# Patient Record
Sex: Female | Born: 1980 | Race: White | Hispanic: No | Marital: Single | Smoking: Current every day smoker
Health system: Southern US, Community
[De-identification: ages and names within clinical notes are randomized; demographics above are authoritative.]

## PROBLEM LIST (undated history)

## (undated) DIAGNOSIS — F419 Anxiety disorder, unspecified: Secondary | ICD-10-CM

## (undated) DIAGNOSIS — F1910 Other psychoactive substance abuse, uncomplicated: Secondary | ICD-10-CM

## (undated) DIAGNOSIS — F32A Depression, unspecified: Secondary | ICD-10-CM

## (undated) DIAGNOSIS — R569 Unspecified convulsions: Secondary | ICD-10-CM

## (undated) DIAGNOSIS — T7840XA Allergy, unspecified, initial encounter: Secondary | ICD-10-CM

## (undated) DIAGNOSIS — G43909 Migraine, unspecified, not intractable, without status migrainosus: Secondary | ICD-10-CM

## (undated) DIAGNOSIS — F329 Major depressive disorder, single episode, unspecified: Secondary | ICD-10-CM

## (undated) DIAGNOSIS — M549 Dorsalgia, unspecified: Secondary | ICD-10-CM

## (undated) DIAGNOSIS — R51 Headache: Secondary | ICD-10-CM

## (undated) HISTORY — DX: Unspecified convulsions: R56.9

## (undated) HISTORY — DX: Other psychoactive substance abuse, uncomplicated: F19.10

## (undated) HISTORY — PX: DILATION AND CURETTAGE OF UTERUS: SHX78

## (undated) HISTORY — DX: Allergy, unspecified, initial encounter: T78.40XA

## (undated) HISTORY — DX: Migraine, unspecified, not intractable, without status migrainosus: G43.909

---
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
## 2002-06-08 ENCOUNTER — Emergency Department (HOSPITAL_COMMUNITY): Admission: EM | Admit: 2002-06-08 | Discharge: 2002-06-08 | Payer: Self-pay | Admitting: Emergency Medicine

## 2002-06-25 ENCOUNTER — Emergency Department (HOSPITAL_COMMUNITY): Admission: EM | Admit: 2002-06-25 | Discharge: 2002-06-26 | Payer: Self-pay | Admitting: Emergency Medicine

## 2007-10-15 ENCOUNTER — Emergency Department (HOSPITAL_COMMUNITY): Admission: EM | Admit: 2007-10-15 | Discharge: 2007-10-15 | Payer: Self-pay | Admitting: Emergency Medicine

## 2008-12-30 ENCOUNTER — Emergency Department (HOSPITAL_COMMUNITY): Admission: EM | Admit: 2008-12-30 | Discharge: 2008-12-30 | Payer: Self-pay | Admitting: Emergency Medicine

## 2009-05-15 ENCOUNTER — Emergency Department (HOSPITAL_COMMUNITY): Admission: EM | Admit: 2009-05-15 | Discharge: 2009-05-15 | Payer: Self-pay | Admitting: Emergency Medicine

## 2009-05-17 ENCOUNTER — Emergency Department (HOSPITAL_COMMUNITY): Admission: EM | Admit: 2009-05-17 | Discharge: 2009-05-17 | Payer: Self-pay | Admitting: Emergency Medicine

## 2009-05-22 ENCOUNTER — Other Ambulatory Visit: Admission: RE | Admit: 2009-05-22 | Discharge: 2009-05-22 | Payer: Self-pay | Admitting: Obstetrics and Gynecology

## 2009-06-20 ENCOUNTER — Ambulatory Visit (HOSPITAL_COMMUNITY): Admission: RE | Admit: 2009-06-20 | Discharge: 2009-06-20 | Payer: Self-pay | Admitting: Obstetrics & Gynecology

## 2009-07-09 ENCOUNTER — Ambulatory Visit (HOSPITAL_COMMUNITY): Admission: RE | Admit: 2009-07-09 | Discharge: 2009-07-09 | Payer: Self-pay | Admitting: Obstetrics & Gynecology

## 2009-08-01 ENCOUNTER — Ambulatory Visit (HOSPITAL_COMMUNITY): Admission: RE | Admit: 2009-08-01 | Discharge: 2009-08-01 | Payer: Self-pay | Admitting: Obstetrics & Gynecology

## 2009-08-15 ENCOUNTER — Ambulatory Visit (HOSPITAL_COMMUNITY): Admission: RE | Admit: 2009-08-15 | Discharge: 2009-08-15 | Payer: Self-pay | Admitting: Obstetrics & Gynecology

## 2009-08-29 ENCOUNTER — Ambulatory Visit (HOSPITAL_COMMUNITY): Admission: RE | Admit: 2009-08-29 | Discharge: 2009-08-29 | Payer: Self-pay | Admitting: Obstetrics & Gynecology

## 2010-07-13 IMAGING — US US OB FOLLOW-UP EACH ADDL GEST (MODIFY)
1 series · 14 of 28 positions shown · non-contrast
Comparison: none

OBSTETRICAL ULTRASOUND:
 This ultrasound was performed in The [HOSPITAL], and the [facility] report will be stored to [REDACTED] PACS.  This report is also available in [HOSPITAL]?s accessANYware.

[Series 1: us ob follow-up each addl gest (modify) · 14 of 91 slices shown]
[im 4/91]
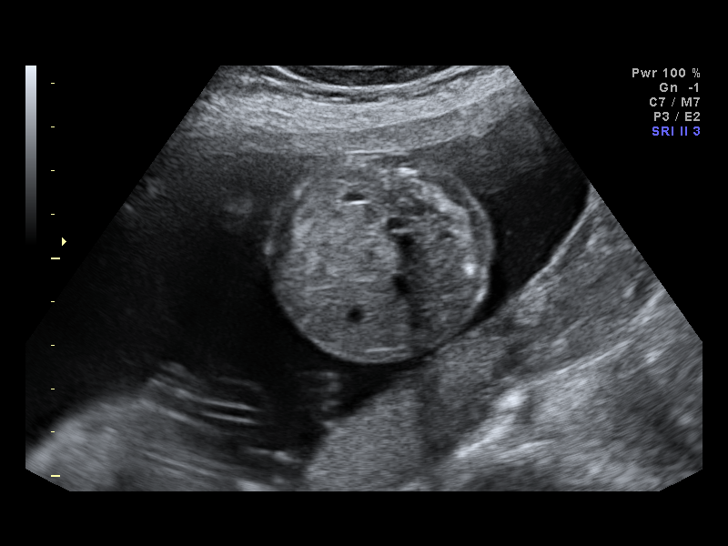
[im 11/91]
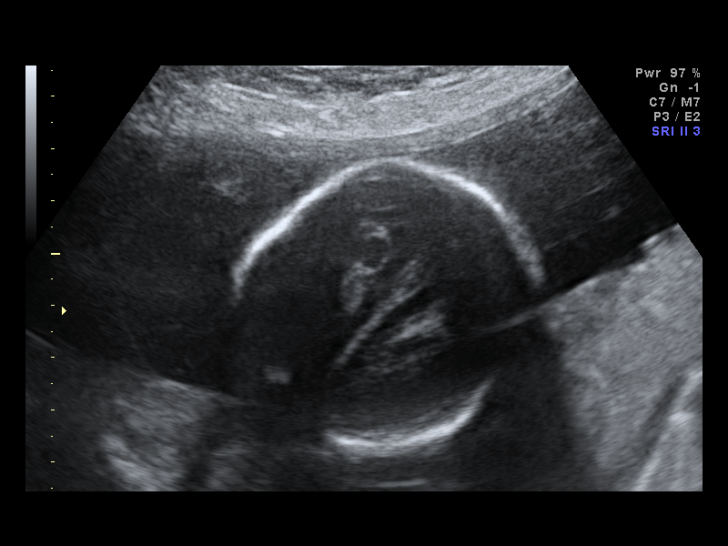
[im 17/91]
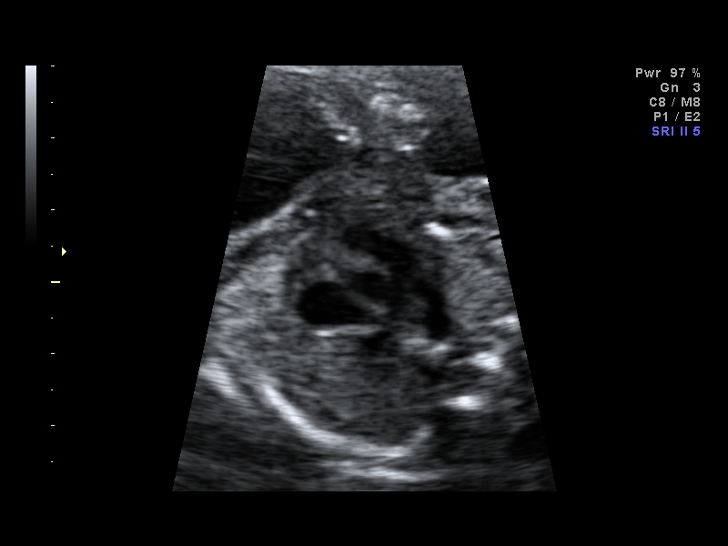
[im 24/91]
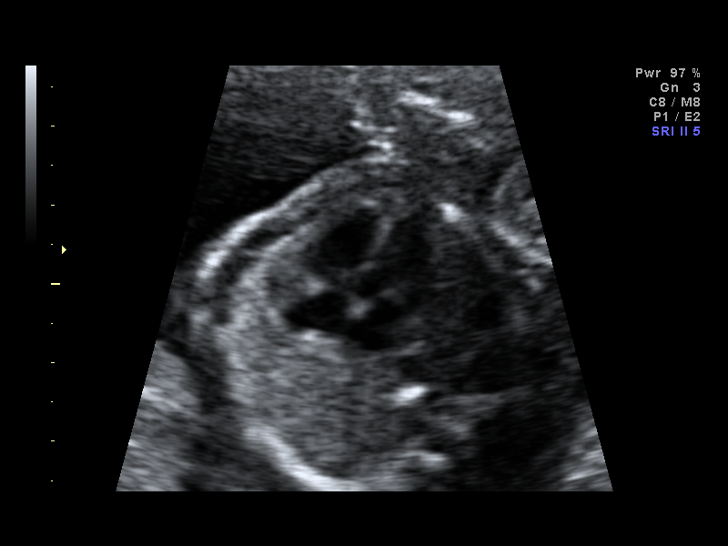
[im 31/91]
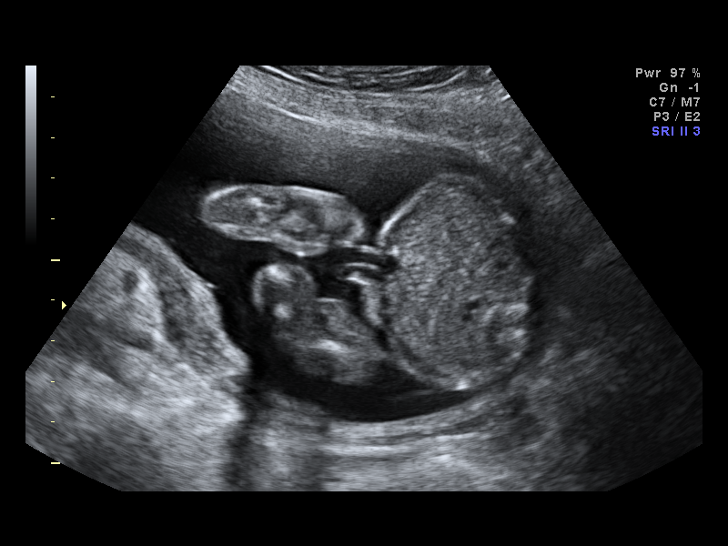
[im 37/91]
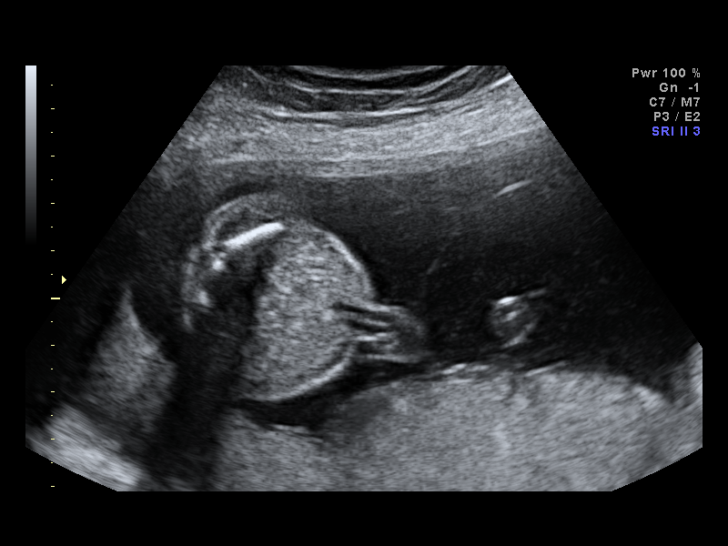
[im 44/91]
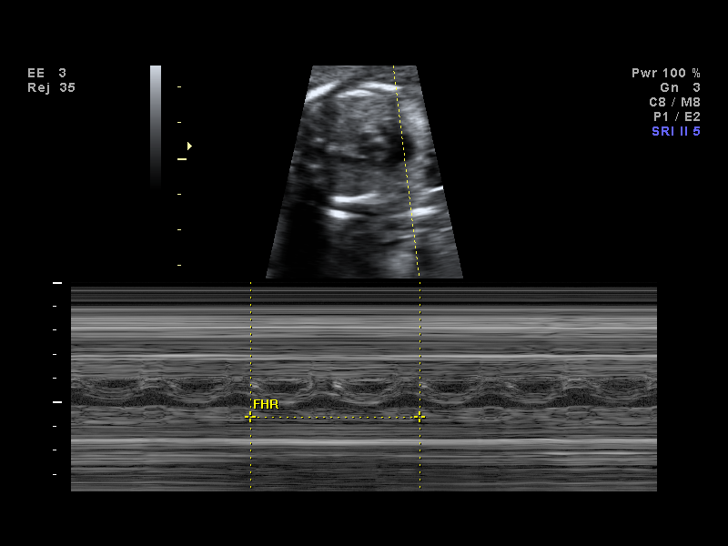
[im 51/91]
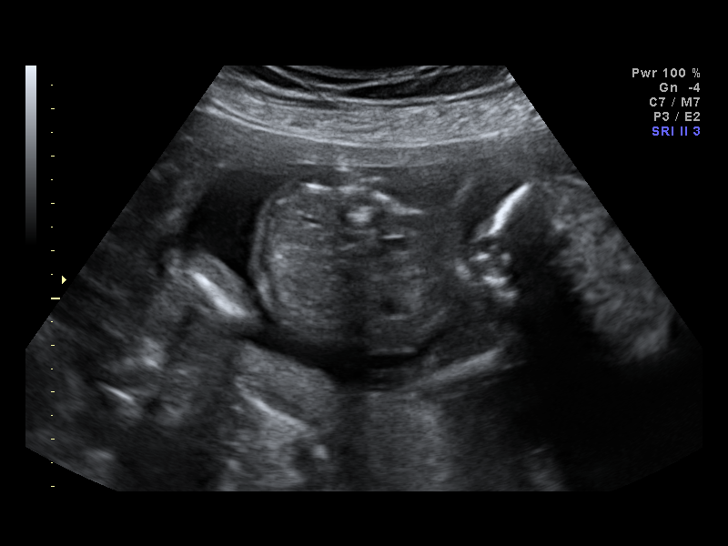
[im 57/91]
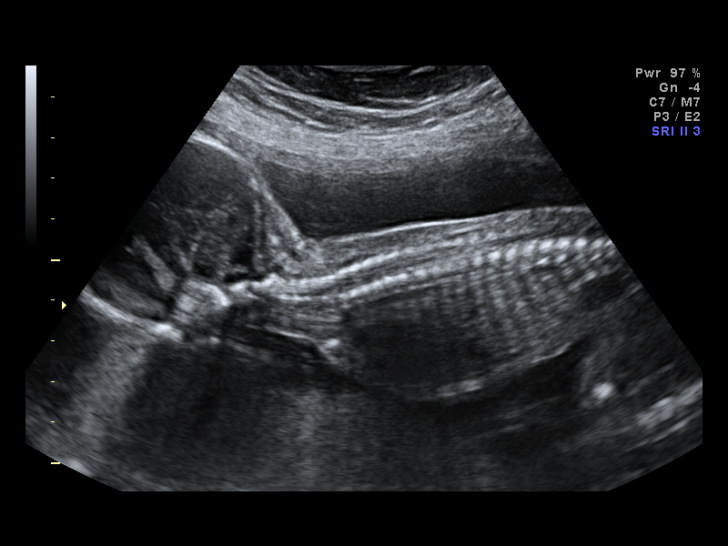
[im 64/91]
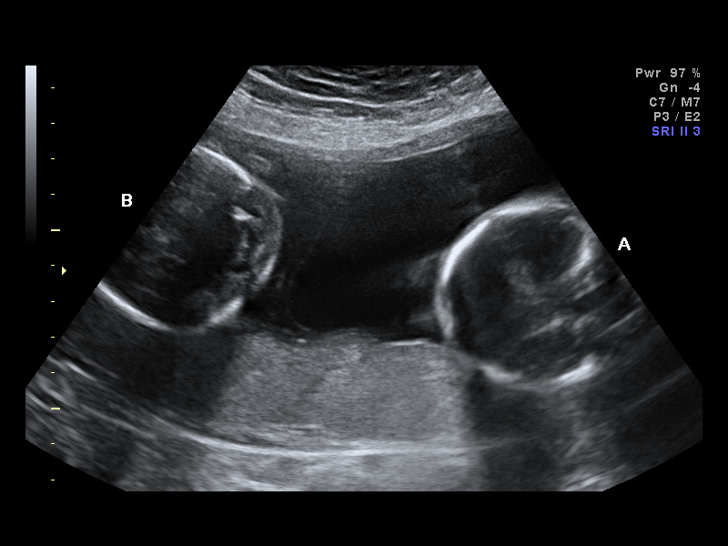
[im 71/91]
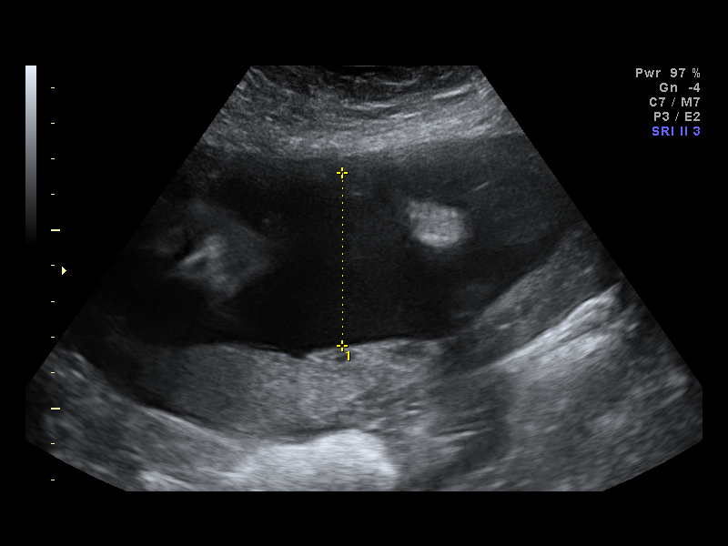
[im 77/91]
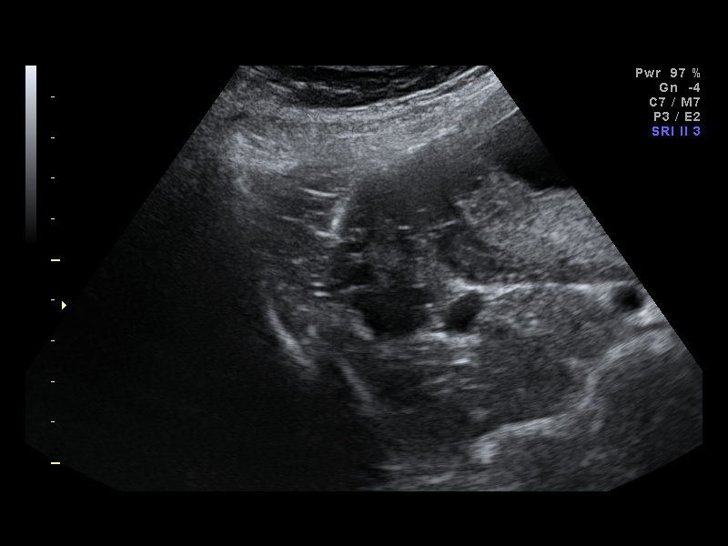
[im 84/91]
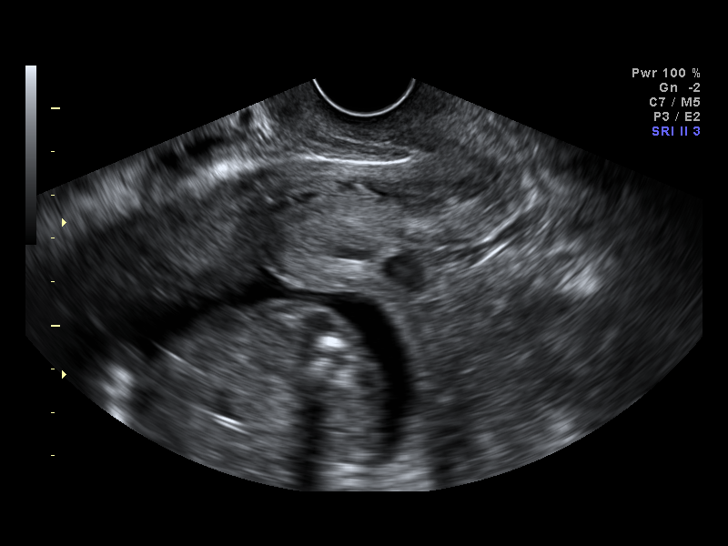
[im 91/91]
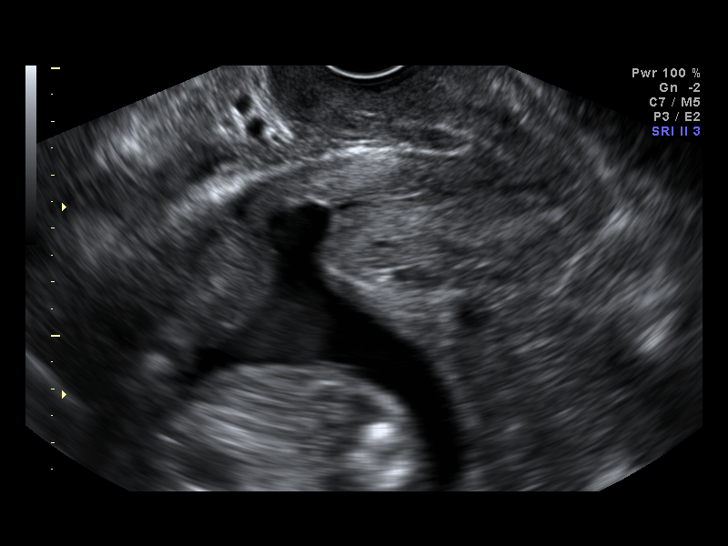

[14 of 28 positions shown; findings below may reference images not displayed]

IMPRESSION: [facility] has also been faxed to the ordering physician.

## 2010-11-22 ENCOUNTER — Encounter: Payer: Self-pay | Admitting: Obstetrics & Gynecology

## 2010-11-23 ENCOUNTER — Encounter: Payer: Self-pay | Admitting: Obstetrics & Gynecology

## 2011-03-19 NOTE — Op Note (Signed)
[facility]  Patient:    Christina Nelson, Christina Nelson Visit Number: [id number] MRN: [id number]          Service Type: Attending:  Langley Gauss, M.D. Dictated by:   Langley Gauss, M.D. Proc. Date: 09/18/01 Adm. Date:  09/18/01                             Operative Report  PREOPERATIVE DIAGNOSIS:  Missed abortion, [redacted] weeks gestation.  POSTOPERATIVE DIAGNOSIS:  Missed abortion, [redacted] weeks gestation.  PROCEDURE PERFORMED:  Dilatation and curettage utilizing a #8 and a #10 suction catheter.  SURGEON:  Langley Gauss, M.D.  ANESTHESIA:  General.  COMPLICATIONS:  None.  ESTIMATED BLOOD LOSS:  500 cc.  SPECIMENS:  Products of conception to pathology for permanent section only.  SUMMARY:  The planned procedure was discussed again with the patient in the preoperative holding area.  The patient was taken to the operating room.  All of her vital signs were stable.  The patient underwent induction of general anesthesia.  She was then placed in the lower lithotomy position.  The perineal area is sterilely prepped utilizing a Betadine solution as well as the cervix.  A sterile speculum examination is then performed and the cervix is noted to be without lesions.  There was no vaginal bleeding or leakage of fluid identified.  The posterior lip of the cervix is then grasped utilizing a single toothed tenaculum.  The uterus is carefully sounded to a depth of 12 cm.  Progressive dilatation is then performed up to a #21 dilator which allows passage of a #8 suction catheter tip into the uterine cavity.  Suctioning is then performed of the entire uterine cavity with minimal amounts of tissue obtained.  This was repeated on 3 occasions at which time tissue became visibly present at the cervical os.  At this point in time it became clear that a large amount of tissue was present and thus a #10, suction catheter tip was called for.  Additional dilatation was required up to a size  #23 dilator which allows passage of the #10 suction catheter tip within the uterine cavity.  Suctioning was then performed of the entire uterine cavity with a large amount of membranous appearing tissue obtained.  The suctioning is continued until no further tissue is obtained.  A banjo curettage is then performed of the entire uterine cavity with no additional tissue obtained.  Again, the #10 suction tip is passed into the uterine cavity.  Tissue suctioning is again performed.  No further tissues obtained thus the procedure is terminated.  Bimanual examination reveals a 10 week size uterus with no adnexal masses.  Then 0.2 mg of IM ______ is then administered following the procedure.  DISCHARGE SUMMARY PLANNED DATE OF DISCHARGE:  September 18, 2001.  DISPOSITION:  Follow up in the office in 1 weeks time at which time she will likely initiate Ortho Tri-Cyclen for birth control purposes.  The patient is given a copy of standardized discharge instructions at time of discharge.  DISCHARGE MEDICATIONS: 1. Vicoprofen 1 p.o. q.6h. p.r.n. for postoperative cramping #20 with no    refill. 2. Doxycycline 100 mg p.o. b.i.d. x 7.  PERTINENT LABORATORY STUDIES:  Hemoglobin 13, Rh-positive blood type.  HOSPITAL COURSE:  The patient to be taken to the operating room where the dilatation and curettage with a #8 and a ______ silk string catheter tip was performed without complications.  Postoperatively the patient did  well.  She had no postoperative complications. She satisfied all criteria prior to discharge.  OPERATIVE FINDINGS:  Discuss with the patients awaiting mother immediately following the procedure. Dictated by:   Langley Gauss, M.D. Attending:  Langley Gauss, M.D. DD:  09/18/01 TD:  09/18/01 Job: [id number]

## 2011-03-19 NOTE — H&P (Signed)
[facility]  Patient:    Christina Nelson, Christina Nelson Visit Number: [id number] MRN: [id number]          Service Type: EMS Location: ED Attending Physician:  Cassell Smiles. Dictated by:   Christin Bach, M.D. Admit Date:  09/15/2001   CC:         Christina Nelson, M.D.   History and Physical  ADMITTING DIAGNOSES:  Missed abortion of birth with miscarriage.  HISTORY OF PRESENT ILLNESS:  This 30 year old female gravida 1, para 0, LMP ? September with a history of irregular periods seen for initial prenatal visit through Dr. Ferne Coe office with confirmation of fetal heart motion when seen in late October, has presented to the emergency room on the morning of September 15, 2001, complaining of light spotting.  She was referred to our office for assessment where ultrasound revealed a fetal growth that occurred reaching a crown rump length of 4.8 cm consistent with 11.[redacted] week gestational age.  The patient unfortunately has no fetal heart motion present and the diagnosis is abortion is confirmed.  Cervix is closed and long.  The patient is counselled over the etiologies of miscarriage and is aware that this is not her fault or something that could have been prevented.  She has been offered continued observation versus proceeding with suction D&C.  The patient requestED to proceed with D&C next week.  She will be scheduled for Dr. Lisette Grinder to perform [facility] on Monday.  The patient is aware of the usual risk of procedures including bleeding and infection can exist. The patient wishes to wait for D&C and we support that decision.  PAST MEDICAL HISTORY:  Benign.  SOCIAL HISTORY:  Negative.  ALLERGIES:  No known drug allergies.  PHYSICAL EXAMINATION:  GENERAL:  Not anxious, shy, quiet, Caucasian female, alert and oriented x 3.  HEENT:  PERRLA.  Extraocular movements intact.  NECK:  Supple.  Trachea midline.  VITAL SIGNS:  Height:  5 feet 3 inches.  Weight 113 pounds.   Blood pressure 115/75, pulse 76, urinalysis positive for blood, ketones, and protein.  PERTINENT GYN EXAMINATION:  Shows normal external genitalia with physiologic secretions, light spotting and discharge.  Uterus is 11.6 weeks, background rump length of 4.8 cm.  ASSESSMENT:  Missed abortion (miscarriage) [redacted] weeks gestation.  PLAN:  Suction D&C. Dictated by:   Christin Bach, M.D. Attending Physician:  Cassell Smiles DD:  09/15/01 TD:  09/15/01 Job: [id number]

## 2011-03-19 NOTE — Op Note (Signed)
[facility]  Patient:    Christina Nelson, Christina Nelson Visit Number: [id number] MRN: [id number]          Service Type: DSU Location: DAY Attending Physician:  Jeri Cos. Dictated by:   Langley Gauss, M.D. Proc. Date: 11/09/01 Admit Date:  11/09/2001 Discharge Date: 11/09/2001                             Operative Report  PREOPERATIVE DIAGNOSES: 1. Retained products of conception following previous dilatation and curettage    dated November 2002. 2. Irregular menses.  POSTOPERATIVE DIAGNOSES: 1. Retained products of conception following previous dilatation and curettage    dated November 2002. 2. Irregular menses.  PROCEDURE PERFORMED:  Dilatation and curettage utilizing #10 suction catheter tip.  PERTINENT LABORATORY STUDIES:  Hemoglobin and hematocrit 14.2/40.2, RH positive blood type.  DISCHARGE MEDICATIONS:  Doxycycline and Vicoprofen.  The patient likewise administered 1 g of IV Ancef just prior to performance of the procedure.  The patient is given a copy of the standardized discharge instructions.  She was previously given a prescription for birth control pills which she is advised she can begin taking this Sunday.  HOSPITAL COURSE:  The patient admitted through the surgical ambulatory unit. Taken to the OR where she was placed in the dorsal lithotomy position.  The patient was given appropriate IV sedation per the anesthesia staff.  Speculum was then placed within the vaginal vault and cervix was visualized and noted to be a small amount of tissue at the endocervical os.  The patients perineal area had been sterilely prepped.  The cervix was now sterilely prepped, direct application of Betadine-soaked swab.  The anterior lip of the cervix grasped with a single-tooth tenaculum.  The cervical os was noted to be dilated on previous examination about 0.5 cm.  This allowed passage of a #10 dilator through the endocervical os.  Dilatation was  continued utilizing the progressive dilators up to a size #23 dilator.  This is done atraumatically. This allows passage of a #10 suction catheter tip into the lower uterine segment and into the uterine cavity itself.  Gentle suction was then applied with a slow circular withdrawing motion.  The entire uterine cavity was suctioned with small amounts of obvious descidual-type tissue obtained from the endometrial cavity.  After initial suctioning, a fine banjo curettage is performed with additional tissue identified at the uterine fundus which is gently scraped.  Curettage is continued until there is a fine, gritty feeling in all quadrants of the uterus of the uterus to include the uterine fundus. The suction is then repeated on one occasion with additional small amounts of tissue removed.  The procedure is then terminated.  The patient was taken out of the dorsal lithotomy position and taken to the recovery room in stable condition.  I did have an opportunity to discuss the operative findings with the patients awaiting mother to include the retained tissue identified and removed during the operative procedure.  I was also able to discuss the operative findings with the patient herself in the postoperative recovery area, stress the importance of the patient taking the birth control pills on a daily basis for birth control purposes.  The patient is, however, advised that during the first several months of birth control pill use, there may be onset of irregular menses until her body fully adjusts to the birth control pill use. The patient likewise is advised no sexual activity  for one week as cervix is noted to be dilated at this time.  The patient is discharged home on date of service.  RH positive status is confirmed prior to discharge. Dictated by:   Langley Gauss, M.D. Attending Physician:  Jeri Cos. DD:  11/14/01 TD:  11/14/01 Job: [phone]

## 2012-08-09 ENCOUNTER — Emergency Department (HOSPITAL_COMMUNITY)
Admission: EM | Admit: 2012-08-09 | Discharge: 2012-08-09 | Disposition: A | Discharge status: Home / Self Care | Payer: Medicaid Other | Attending: Emergency Medicine | Admitting: Emergency Medicine

## 2012-08-09 ENCOUNTER — Encounter (HOSPITAL_COMMUNITY): Payer: Self-pay | Admitting: Emergency Medicine

## 2012-08-09 DIAGNOSIS — G43909 Migraine, unspecified, not intractable, without status migrainosus: Secondary | ICD-10-CM | POA: Insufficient documentation

## 2012-08-09 DIAGNOSIS — F17200 Nicotine dependence, unspecified, uncomplicated: Secondary | ICD-10-CM | POA: Insufficient documentation

## 2012-08-09 DIAGNOSIS — M545 Low back pain: Secondary | ICD-10-CM

## 2012-08-09 DIAGNOSIS — N390 Urinary tract infection, site not specified: Secondary | ICD-10-CM | POA: Insufficient documentation

## 2012-08-09 DIAGNOSIS — R51 Headache: Secondary | ICD-10-CM

## 2012-08-09 HISTORY — DX: Major depressive disorder, single episode, unspecified: F32.9

## 2012-08-09 HISTORY — DX: Dorsalgia, unspecified: M54.9

## 2012-08-09 HISTORY — DX: Anxiety disorder, unspecified: F41.9

## 2012-08-09 HISTORY — DX: Depression, unspecified: F32.A

## 2012-08-09 HISTORY — DX: Headache: R51

## 2012-08-09 LAB — URINE MICROSCOPIC-ADD ON

## 2012-08-09 LAB — PREGNANCY, URINE: Preg Test, Ur: NEGATIVE

## 2012-08-09 LAB — URINALYSIS, ROUTINE W REFLEX MICROSCOPIC
Leukocytes, UA: NEGATIVE
Protein, ur: NEGATIVE mg/dL
Specific Gravity, Urine: <1.005 — ABNORMAL LOW (ref 1.005–1.030)
pH: 6.5 (ref 5.0–8.0)

## 2012-08-09 MED ORDER — NAPROXEN 250 MG PO TABS: 250.0000 mg | ORAL_TABLET | Freq: Two times a day (BID) | ORAL | Status: DC

## 2012-08-09 MED ORDER — NITROFURANTOIN MONOHYD MACRO 100 MG PO CAPS: 100.0000 mg | ORAL_CAPSULE | Freq: Two times a day (BID) | ORAL | Status: DC

## 2012-08-09 NOTE — ED Notes (Signed)
Patient c/o intermittent migraines for "several months." Seen PCP before moving and was put on Zoloft to see if it would help with stress/headache. Patient denies any relief. Patient reports nausea denies vomiting. Patient also c/o lower back pain that radiates into mid back that has progressively getting worse over the past 6 months.

## 2012-08-09 NOTE — ED Notes (Signed)
C/o back pain

## 2012-08-09 NOTE — ED Provider Notes (Signed)
History     CSN: [id number]  Arrival date & time 08/09/12  1125   First MD Initiated Contact with Patient 08/09/12 1310      Chief Complaint  Patient presents with  . Migraine  . Back Pain    HPI Pt was seen at 1320.  Per pt, c/o gradual onset and persistence of constant acute flair of her chronic migraine headache for the past 6 months.  Describes the headache as per her usual chronic migraine headache pain pattern for many years.  Denies headache was sudden or maximal in onset or at any time.  Denies visual changes, no neck pain, no rash.  Pt also c/o gradual onset and persistence of constant acute flair of her chronic low back "pain" for the past 6 months.  Denies any change in her usual chronic pain pattern.  Pain worsens with palpation of the area and body position changes. Denies incont/retention of bowel or bladder, no saddle anesthesia, no focal motor weakness, no tingling/numbness in extremities, no fevers, no injury, no abd pain. The patient has a significant history of similar symptoms previously, recently being evaluated for this complaint and multiple prior evals for same. Pt also c/o gradual onset and persistence of constant dysuria and "foul smelling urine" since this morning.  Denies vaginal bleeding/discharge, no pelvic pain, no flank pain, no N/V/D.       Past Medical History  Diagnosis Date  . Anxiety   . Depression   . Headache   . Back pain     Past Surgical History  Procedure Date  . Cesarean section   . Dilation and curettage of uterus     Family History  Problem Relation Age of Onset  . Cancer Other     History  Substance Use Topics  . Smoking status: Current Every Day Smoker -- 1.0 packs/day for 10 years    Types: Cigarettes  . Smokeless tobacco: Never Used  . Alcohol Use: No    OB History    Grav Para Term Preterm Abortions TAB SAB Ect Mult Living   4 2  2 2  2   2       Review of Systems ROS: Statement: All systems negative except as  marked or noted in the HPI; Constitutional: Negative for fever and chills. ; ; Eyes: Negative for eye pain, redness and discharge. ; ; ENMT: Negative for ear pain, hoarseness, nasal congestion, sinus pressure and sore throat. ; ; Cardiovascular: Negative for chest pain, palpitations, diaphoresis, dyspnea and peripheral edema. ; ; Respiratory: Negative for cough, wheezing and stridor. ; ; Gastrointestinal: Negative for nausea, vomiting, diarrhea, abdominal pain, blood in stool, hematemesis, jaundice and rectal bleeding. . ; ; Genitourinary: +dysuria. Negative for flank pain and hematuria.;; GYN:  No vaginal bleeding, no vaginal discharge, no vulvar pain.;;   Musculoskeletal: +LBP. Negative for neck pain. Negative for swelling and trauma.; ; Skin: Negative for pruritus, rash, abrasions, blisters, bruising and skin lesion.; ; Neuro: +headache. Negative for lightheadedness and neck stiffness. Negative for weakness, altered level of consciousness , altered mental status, extremity weakness, paresthesias, involuntary movement, seizure and syncope.      Allergies  Amoxicillin and Penicillins  Home Medications   Current Outpatient Rx  Name Route Sig Dispense Refill  . ASPIRIN-ACETAMINOPHEN-CAFFEINE 250-250-65 MG PO TABS Oral Take 2 tablets by mouth every 4 (four) hours as needed. Headaches.    . SERTRALINE HCL 50 MG PO TABS Oral Take 50 mg by mouth daily.  BP 131/96  Pulse 103  Temp 98.3 F (36.8 C) (Oral)  Resp 20  Ht 5\' 3"  (1.6 m)  Wt 130 lb (58.968 kg)  BMI 23.03 kg/m2  SpO2 100%  LMP 07/27/2012  Physical Exam 1325: Physical examination:  Nursing notes reviewed; Vital signs and O2 SAT reviewed;  Constitutional: Well developed, Well nourished, Well hydrated, In no acute distress; Head:  Normocephalic, atraumatic; Eyes: EOMI, PERRL, No scleral icterus; ENMT: Mouth and pharynx normal, Mucous membranes moist; Neck: Supple, Full range of motion, No lymphadenopathy; Cardiovascular: Regular rate  and rhythm, No murmur, rub, or gallop; Respiratory: Breath sounds clear & equal bilaterally, No rales, rhonchi, wheezes.  Speaking full sentences with ease, Normal respiratory effort/excursion; Chest: Nontender, Movement normal; Abdomen: Soft, Nontender, Nondistended, Normal bowel sounds; Genitourinary: No CVA tenderness; Spine:  No midline CS, TS, LS tenderness.  +TTP bilat lumbar paraspinal muscles.; Extremities: Pulses normal, No tenderness, No edema, No calf edema or asymmetry.; Neuro: AA&Ox3, Major CN grossly intact.  Speech clear. Strength 5/5 equal bilat UE's and LE's, including great toe dorsiflexion.  DTR 2/4 equal bilat UE's and LE's.  No gross sensory deficits.  Neg straight leg raises bilat. Gait steady..; Skin: Color normal, Warm, Dry.   ED Course  Procedures    MDM  MDM Reviewed: nursing note and vitals Interpretation: labs     Results for orders placed during the hospital encounter of 08/09/12  PREGNANCY, URINE      Component Value Range   Preg Test, Ur NEGATIVE  NEGATIVE  URINALYSIS, ROUTINE W REFLEX MICROSCOPIC      Component Value Range   Color, Urine YELLOW  YELLOW   APPearance CLEAR  CLEAR   Specific Gravity, Urine <1.005 (*) 1.005 - 1.030   pH 6.5  5.0 - 8.0   Glucose, UA NEGATIVE  NEGATIVE mg/dL   Hgb urine dipstick NEGATIVE  NEGATIVE   Bilirubin Urine NEGATIVE  NEGATIVE   Ketones, ur NEGATIVE  NEGATIVE mg/dL   Protein, ur NEGATIVE  NEGATIVE mg/dL   Urobilinogen, UA 0.2  0.0 - 1.0 mg/dL   Nitrite POSITIVE (*) NEGATIVE   Leukocytes, UA NEGATIVE  NEGATIVE  URINE MICROSCOPIC-ADD ON      Component Value Range   Squamous Epithelial / LPF RARE  RARE   WBC, UA 3-6  <3 WBC/hpf   Bacteria, UA MANY (*) RARE     1400:  All complaints chronic for at least the past 6+ months.  No change in complaints.  Pt frequently lifting her toddler son while I was in the room (bending over at the waist and lifting with her back); good lifting technique reviewed with pt.  Today's  new complaint was dysuria. +UTI; will treat.  Wants to go home now.  Dx testing d/w pt.  Questions answered.  Verb understanding, agreeable to d/c home with outpt f/u with her PMD.          Laray Anger, DO 08/12/12 2136

## 2013-06-06 ENCOUNTER — Telehealth: Payer: Self-pay | Admitting: Nurse Practitioner

## 2013-06-06 ENCOUNTER — Encounter: Payer: Self-pay | Admitting: General Practice

## 2013-06-06 ENCOUNTER — Ambulatory Visit (INDEPENDENT_AMBULATORY_CARE_PROVIDER_SITE_OTHER): Payer: Medicaid Other | Admitting: General Practice

## 2013-06-06 VITALS — BP 110/63 | HR 82 | Temp 97.8°F | Ht 63.0 in | Wt 116.0 lb

## 2013-06-06 DIAGNOSIS — R350 Frequency of micturition: Secondary | ICD-10-CM

## 2013-06-06 DIAGNOSIS — R300 Dysuria: Secondary | ICD-10-CM

## 2013-06-06 DIAGNOSIS — N390 Urinary tract infection, site not specified: Secondary | ICD-10-CM

## 2013-06-06 LAB — POCT URINALYSIS DIPSTICK
Nitrite, UA: POSITIVE
Urobilinogen, UA: NEGATIVE
pH, UA: 6.5

## 2013-06-06 LAB — POCT UA - MICROSCOPIC ONLY: Yeast, UA: NEGATIVE

## 2013-06-06 MED ORDER — CIPROFLOXACIN HCL 500 MG PO TABS: 500.0000 mg | ORAL_TABLET | Freq: Two times a day (BID) | ORAL | Status: DC

## 2013-06-06 NOTE — Progress Notes (Signed)
  Subjective:    Patient ID: Christina Nelson, female    DOB: Oct 25, 1981, 32 y.o.   MRN: [id number]  Urinary Tract Infection  This is a new problem. The current episode started 1 to 4 weeks ago. The problem occurs every urination. The problem has been gradually worsening. There has been no fever. She is not sexually active. There is no history of pyelonephritis. Associated symptoms include flank pain, frequency, nausea and urgency. Pertinent negatives include no chills, hematuria or vomiting. She has tried nothing for the symptoms. Her past medical history is significant for recurrent UTIs. There is no history of kidney stones or a single kidney.  Reports irregular menses, she denies being sexually active.     Review of Systems  Constitutional: Negative for fever and chills.  Respiratory: Negative for chest tightness and shortness of breath.   Cardiovascular: Negative for chest pain and palpitations.  Gastrointestinal: Positive for nausea. Negative for vomiting.  Genitourinary: Positive for dysuria, urgency, frequency, flank pain and difficulty urinating. Negative for hematuria.  Neurological: Negative for dizziness, weakness and headaches.       Objective:   Physical Exam  Constitutional: She is oriented to person, place, and time. She appears well-developed and well-nourished.  Cardiovascular: Normal rate, regular rhythm and normal heart sounds.   Pulmonary/Chest: Effort normal and breath sounds normal. No respiratory distress. She exhibits no tenderness.  Abdominal: Soft. Bowel sounds are normal. She exhibits no distension. There is tenderness in the suprapubic area. There is no CVA tenderness.  Neurological: She is alert and oriented to person, place, and time.  Skin: Skin is warm and dry.  Psychiatric: She has a normal mood and affect.   Results for orders placed in visit on 06/06/13  POCT UA - MICROSCOPIC ONLY      Result Value Range   WBC, Ur, HPF, POC 3-10     RBC, urine,  microscopic 40-50     Bacteria, U Microscopic many     Mucus, UA mod     Epithelial cells, urine per micros occ     Crystals, Ur, HPF, POC neg     Casts, Ur, LPF, POC few     Yeast, UA neg    POCT URINALYSIS DIPSTICK      Result Value Range   Color, UA Lizanne     Clarity, UA cloudy     Glucose, UA neg     Bilirubin, UA neg     Ketones, UA neg     Spec Grav, UA 1.020     Blood, UA mod     pH, UA 6.5     Protein, UA mod     Urobilinogen, UA negative     Nitrite, UA positive     Leukocytes, UA Trace            Assessment & Plan:  1. Frequency of urination - POCT UA - Microscopic Only - POCT urinalysis dipstick - Urine culture  2. UTI (urinary tract infection) - ciprofloxacin (CIPRO) 500 MG tablet; Take 1 tablet (500 mg total) by mouth 2 (two) times daily.  Dispense: 20 tablet; Refill: 0 -Increase fluid intake AZO over the counter X2 days Frequent voiding Proper perineal hygiene RTO prn Culture pending Patient verbalized understanding Coralie Keens, FNP-C

## 2013-06-06 NOTE — Patient Instructions (Addendum)
Urinary Tract Infection  Urinary tract infections (UTIs) can develop anywhere along your urinary tract. Your urinary tract is your body's drainage system for removing wastes and extra water. Your urinary tract includes two kidneys, two ureters, a bladder, and a urethra. Your kidneys are a pair of bean-shaped organs. Each kidney is about the size of your fist. They are located below your ribs, one on each side of your spine.  CAUSES  Infections are caused by microbes, which are microscopic organisms, including fungi, viruses, and bacteria. These organisms are so small that they can only be seen through a microscope. Bacteria are the microbes that most commonly cause UTIs.  SYMPTOMS   Symptoms of UTIs may vary by age and gender of the patient and by the location of the infection. Symptoms in young women typically include a frequent and intense urge to urinate and a painful, burning feeling in the bladder or urethra during urination. Older women and men are more likely to be tired, shaky, and weak and have muscle aches and abdominal pain. A fever may mean the infection is in your kidneys. Other symptoms of a kidney infection include pain in your back or sides below the ribs, nausea, and vomiting.  DIAGNOSIS  To diagnose a UTI, your caregiver will ask you about your symptoms. Your caregiver also will ask to provide a urine sample. The urine sample will be tested for bacteria and white blood cells. White blood cells are made by your body to help fight infection.  TREATMENT   Typically, UTIs can be treated with medication. Because most UTIs are caused by a bacterial infection, they usually can be treated with the use of antibiotics. The choice of antibiotic and length of treatment depend on your symptoms and the type of bacteria causing your infection.  HOME CARE INSTRUCTIONS   If you were prescribed antibiotics, take them exactly as your caregiver instructs you. Finish the medication even if you feel better after you  have only taken some of the medication.   Drink enough water and fluids to keep your urine clear or pale yellow.   Avoid caffeine, tea, and carbonated beverages. They tend to irritate your bladder.   Empty your bladder often. Avoid holding urine for long periods of time.   Empty your bladder before and after sexual intercourse.   After a bowel movement, women should cleanse from front to back. Use each tissue only once.  SEEK MEDICAL CARE IF:    You have back pain.   You develop a fever.   Your symptoms do not begin to resolve within 3 days.  SEEK IMMEDIATE MEDICAL CARE IF:    You have severe back pain or lower abdominal pain.   You develop chills.   You have nausea or vomiting.   You have continued burning or discomfort with urination.  MAKE SURE YOU:    Understand these instructions.   Will watch your condition.   Will get help right away if you are not doing well or get worse.  Document Released: 07/28/2005 Document Revised: 04/18/2012 Document Reviewed: 11/26/2011  [employer] Patient Information 2014 [employer]

## 2013-06-06 NOTE — Telephone Encounter (Signed)
Appt scheduled for this afternoon. U/A ordered.  Patient will leave specimen after checkin.

## 2013-06-22 ENCOUNTER — Ambulatory Visit (INDEPENDENT_AMBULATORY_CARE_PROVIDER_SITE_OTHER): Payer: Medicaid Other | Admitting: General Practice

## 2013-06-22 ENCOUNTER — Encounter: Payer: Self-pay | Admitting: General Practice

## 2013-06-22 VITALS — BP 122/76 | HR 80 | Temp 98.2°F | Ht 63.0 in | Wt 119.0 lb

## 2013-06-22 DIAGNOSIS — R109 Unspecified abdominal pain: Secondary | ICD-10-CM

## 2013-06-22 DIAGNOSIS — N390 Urinary tract infection, site not specified: Secondary | ICD-10-CM

## 2013-06-22 DIAGNOSIS — R102 Pelvic and perineal pain: Secondary | ICD-10-CM

## 2013-06-22 DIAGNOSIS — R350 Frequency of micturition: Secondary | ICD-10-CM

## 2013-06-22 DIAGNOSIS — N9489 Other specified conditions associated with female genital organs and menstrual cycle: Secondary | ICD-10-CM

## 2013-06-22 LAB — POCT URINALYSIS DIPSTICK
Glucose, UA: NEGATIVE
Urobilinogen, UA: NEGATIVE

## 2013-06-22 LAB — POCT URINE PREGNANCY: Preg Test, Ur: NEGATIVE

## 2013-06-22 LAB — POCT UA - MICROSCOPIC ONLY: Crystals, Ur, HPF, POC: NEGATIVE

## 2013-06-22 MED ORDER — NITROFURANTOIN MONOHYD MACRO 100 MG PO CAPS: 100.0000 mg | ORAL_CAPSULE | Freq: Two times a day (BID) | ORAL | Status: DC

## 2013-06-22 NOTE — Addendum Note (Signed)
Addended by: Philomena Doheny E on: 06/22/2013 04:56 PM   Modules accepted: Orders

## 2013-06-22 NOTE — Progress Notes (Signed)
  Subjective:    Patient ID: Christina Nelson, female    DOB: 03-31-1981, 32 y.o.   MRN: [id number]  HPI  Patient presents today for follow up of urinary frequency, pressure in pelvic area, strong urine. She reports while being on medication, the symptoms improved, but didn't completely resolve.     Review of Systems  Constitutional: Negative for fever and chills.  Respiratory: Negative for chest tightness and shortness of breath.   Cardiovascular: Negative for chest pain and palpitations.  Gastrointestinal: Negative for abdominal pain.  Genitourinary: Positive for urgency and frequency. Negative for vaginal discharge.  Neurological: Negative for dizziness, weakness and headaches.       Objective:   Physical Exam  Constitutional: She is oriented to person, place, and time. She appears well-developed and well-nourished.  Cardiovascular: Normal rate, regular rhythm and normal heart sounds.   Pulmonary/Chest: Effort normal and breath sounds normal.  Abdominal: Soft. Bowel sounds are normal. She exhibits no distension. There is tenderness in the suprapubic area.  Neurological: She is alert and oriented to person, place, and time.  Skin: Skin is warm and dry.  Psychiatric: She has a normal mood and affect.   Results for orders placed in visit on 06/22/13  POCT URINALYSIS DIPSTICK      Result Value Range   Color, UA yellow     Clarity, UA cloudy     Glucose, UA neg     Bilirubin, UA neg     Ketones, UA neg     Spec Grav, UA 1.020     Blood, UA trcae     pH, UA 5.0     Protein, UA small     Urobilinogen, UA negative     Nitrite, UA +     Leukocytes, UA Negative    POCT UA - MICROSCOPIC ONLY      Result Value Range   WBC, Ur, HPF, POC occ     RBC, urine, microscopic 5-10     Bacteria, U Microscopic many     Mucus, UA mod     Epithelial cells, urine per micros few     Crystals, Ur, HPF, POC neg     Casts, Ur, LPF, POC neg     Yeast, UA neg           Assessment & Plan:   1. Frequency of urination - POCT urinalysis dipstick - POCT UA - Microscopic Only - Urine culture  2. Abdominal  pain, other specified site - Urine culture  3. Pelvic pressure in female - POCT urine pregnancy  4. UTI (urinary tract infection) - nitrofurantoin, macrocrystal-monohydrate, (MACROBID) 100 MG capsule; Take 1 capsule (100 mg total) by mouth 2 (two) times daily.  Dispense: 20 capsule; Refill: 0  5. Recurrent UTI - Ambulatory referral to Urology -Increase fluid intake AZO over the counter X2 days Frequent voiding Proper perineal hygiene RTO prn and in 2 weeks for urine recheck Patient verbalized understanding Coralie Keens, FNP-C

## 2013-06-22 NOTE — Patient Instructions (Addendum)
Urinary Tract Infection  Urinary tract infections (UTIs) can develop anywhere along your urinary tract. Your urinary tract is your body's drainage system for removing wastes and extra water. Your urinary tract includes two kidneys, two ureters, a bladder, and a urethra. Your kidneys are a pair of bean-shaped organs. Each kidney is about the size of your fist. They are located below your ribs, one on each side of your spine.  CAUSES  Infections are caused by microbes, which are microscopic organisms, including fungi, viruses, and bacteria. These organisms are so small that they can only be seen through a microscope. Bacteria are the microbes that most commonly cause UTIs.  SYMPTOMS   Symptoms of UTIs may vary by age and gender of the patient and by the location of the infection. Symptoms in young women typically include a frequent and intense urge to urinate and a painful, burning feeling in the bladder or urethra during urination. Older women and men are more likely to be tired, shaky, and weak and have muscle aches and abdominal pain. A fever may mean the infection is in your kidneys. Other symptoms of a kidney infection include pain in your back or sides below the ribs, nausea, and vomiting.  DIAGNOSIS  To diagnose a UTI, your caregiver will ask you about your symptoms. Your caregiver also will ask to provide a urine sample. The urine sample will be tested for bacteria and white blood cells. White blood cells are made by your body to help fight infection.  TREATMENT   Typically, UTIs can be treated with medication. Because most UTIs are caused by a bacterial infection, they usually can be treated with the use of antibiotics. The choice of antibiotic and length of treatment depend on your symptoms and the type of bacteria causing your infection.  HOME CARE INSTRUCTIONS   If you were prescribed antibiotics, take them exactly as your caregiver instructs you. Finish the medication even if you feel better after you  have only taken some of the medication.   Drink enough water and fluids to keep your urine clear or pale yellow.   Avoid caffeine, tea, and carbonated beverages. They tend to irritate your bladder.   Empty your bladder often. Avoid holding urine for long periods of time.   Empty your bladder before and after sexual intercourse.   After a bowel movement, women should cleanse from front to back. Use each tissue only once.  SEEK MEDICAL CARE IF:    You have back pain.   You develop a fever.   Your symptoms do not begin to resolve within 3 days.  SEEK IMMEDIATE MEDICAL CARE IF:    You have severe back pain or lower abdominal pain.   You develop chills.   You have nausea or vomiting.   You have continued burning or discomfort with urination.  MAKE SURE YOU:    Understand these instructions.   Will watch your condition.   Will get help right away if you are not doing well or get worse.  Document Released: 07/28/2005 Document Revised: 04/18/2012 Document Reviewed: 11/26/2011  [employer] Patient Information 2014 [employer]

## 2013-06-25 LAB — URINE CULTURE

## 2013-12-17 ENCOUNTER — Telehealth: Payer: Self-pay | Admitting: Nurse Practitioner

## 2013-12-24 ENCOUNTER — Encounter: Payer: Self-pay | Admitting: Family Medicine

## 2013-12-24 ENCOUNTER — Ambulatory Visit (INDEPENDENT_AMBULATORY_CARE_PROVIDER_SITE_OTHER): Payer: Medicaid Other | Admitting: Family Medicine

## 2013-12-24 VITALS — BP 127/87 | HR 98 | Temp 99.3°F | Ht 63.0 in | Wt 113.0 lb

## 2013-12-24 DIAGNOSIS — K5289 Other specified noninfective gastroenteritis and colitis: Secondary | ICD-10-CM

## 2013-12-24 DIAGNOSIS — K529 Noninfective gastroenteritis and colitis, unspecified: Secondary | ICD-10-CM

## 2013-12-24 MED ORDER — ONDANSETRON 4 MG PO TBDP: 4.0000 mg | ORAL_TABLET | Freq: Three times a day (TID) | ORAL | Status: DC | PRN

## 2013-12-24 NOTE — Progress Notes (Signed)
   Subjective:    Patient ID: Christina Nelson, female    DOB: 03-Feb-1981, 33 y.o.   MRN: [id number]  HPI Diarrhea: Onset: 2 days  Description: NBNB nausea, vomiting, diarrhea  Modifying factors: none    Symptoms: Incontinence: yes Vomiting: yes Abdominal Pain: minimal  Urgency: yes Relief with defecation: mild Weight loss: no Decreased urine output: yes Lightheadedness: n Recent travel history: no Sick contacts: yes Suspicious food or water: no Change in diet: no    Red Flags: Fever: no Bloody stools: no Recent antibiotics: no Immuncompromised: no     Review of Systems  All other systems reviewed and are negative.       Objective:   Physical Exam  Constitutional: She is oriented to person, place, and time. She appears well-developed and well-nourished.  HENT:  Head: Normocephalic and atraumatic.  Eyes: Conjunctivae are normal. Pupils are equal, round, and reactive to light.  Neck: Normal range of motion. Neck supple.  Cardiovascular: Normal rate and regular rhythm.   Pulmonary/Chest: Effort normal and breath sounds normal.  Abdominal: Soft. Bowel sounds are normal.  Mild generalized abd tenderness   Musculoskeletal: Normal range of motion.  Neurological: She is alert and oriented to person, place, and time.  Skin: Skin is warm.          Assessment & Plan:  Gastroenteritis Likely viral etiology of sxs.  Reassuring GI exam.  Discussed supportive care and infectious/Gi red flags.  rx for prn zofran given  Follow up as needed.

## 2014-01-24 ENCOUNTER — Telehealth: Payer: Self-pay | Admitting: Family Medicine

## 2014-01-24 NOTE — Telephone Encounter (Signed)
error 

## 2014-09-02 ENCOUNTER — Encounter: Payer: Self-pay | Admitting: Family Medicine

## 2016-02-02 ENCOUNTER — Encounter (HOSPITAL_COMMUNITY): Payer: Self-pay | Admitting: Emergency Medicine

## 2016-02-02 ENCOUNTER — Emergency Department (HOSPITAL_COMMUNITY)
Admission: EM | Admit: 2016-02-02 | Discharge: 2016-02-02 | Disposition: A | Discharge status: Home / Self Care | Payer: Medicaid Other | Attending: Emergency Medicine | Admitting: Emergency Medicine

## 2016-02-02 ENCOUNTER — Emergency Department (HOSPITAL_COMMUNITY): Payer: Medicaid Other

## 2016-02-02 DIAGNOSIS — F17210 Nicotine dependence, cigarettes, uncomplicated: Secondary | ICD-10-CM | POA: Insufficient documentation

## 2016-02-02 DIAGNOSIS — F329 Major depressive disorder, single episode, unspecified: Secondary | ICD-10-CM | POA: Insufficient documentation

## 2016-02-02 DIAGNOSIS — R0602 Shortness of breath: Secondary | ICD-10-CM | POA: Insufficient documentation

## 2016-02-02 DIAGNOSIS — R0789 Other chest pain: Secondary | ICD-10-CM | POA: Diagnosis not present

## 2016-02-02 DIAGNOSIS — R079 Chest pain, unspecified: Secondary | ICD-10-CM

## 2016-02-02 LAB — CBC WITH DIFFERENTIAL/PLATELET
Basophils Absolute: 0 10*3/uL (ref 0.0–0.1)
Basophils Relative: 1 %
EOS ABS: 0.1 10*3/uL (ref 0.0–0.7)
EOS PCT: 1 %
HCT: 41.8 % (ref 36.0–46.0)
Hemoglobin: 14.7 g/dL (ref 12.0–15.0)
LYMPHS ABS: 3 10*3/uL (ref 0.7–4.0)
LYMPHS PCT: 37 %
MCH: 31.3 pg (ref 26.0–34.0)
MCHC: 35.2 g/dL (ref 30.0–36.0)
MCV: 89.1 fL (ref 78.0–100.0)
MONO ABS: 0.5 10*3/uL (ref 0.1–1.0)
Monocytes Relative: 7 %
Neutro Abs: 4.3 10*3/uL (ref 1.7–7.7)
Neutrophils Relative %: 54 %
PLATELETS: 247 10*3/uL (ref 150–400)
RBC: 4.69 MIL/uL (ref 3.87–5.11)
RDW: 12.3 % (ref 11.5–15.5)
WBC: 7.9 10*3/uL (ref 4.0–10.5)

## 2016-02-02 LAB — BASIC METABOLIC PANEL
Anion gap: 8 (ref 5–15)
BUN: 11 mg/dL (ref 6–20)
CALCIUM: 9.5 mg/dL (ref 8.9–10.3)
CO2: 31 mmol/L (ref 22–32)
CREATININE: 0.54 mg/dL (ref 0.44–1.00)
Chloride: 101 mmol/L (ref 101–111)
GFR calc Af Amer: >60 mL/min (ref >60)
GLUCOSE: 92 mg/dL (ref 65–99)
Potassium: 3.8 mmol/L (ref 3.5–5.1)
SODIUM: 140 mmol/L (ref 135–145)

## 2016-02-02 LAB — D-DIMER, QUANTITATIVE: D-Dimer, Quant: 0.41 ug/mL-FEU (ref 0.00–0.50)

## 2016-02-02 LAB — TROPONIN I

## 2016-02-02 MED ORDER — HYDROCODONE-ACETAMINOPHEN 5-325 MG PO TABS
1.0000 | ORAL_TABLET | ORAL | Status: AC
Start: 2016-02-02 — End: 2016-02-02
  Administered 2016-02-02: 1 via ORAL
  Filled 2016-02-02: qty 1

## 2016-02-02 MED ORDER — NAPROXEN 500 MG PO TABS: 500.0000 mg | ORAL_TABLET | Freq: Two times a day (BID) | ORAL | Status: DC

## 2016-02-02 NOTE — Discharge Instructions (Signed)

## 2016-02-02 NOTE — ED Notes (Signed)
Pt c/o cp x 2 days radiating into neck and left arm.

## 2016-02-02 NOTE — ED Provider Notes (Signed)
CSN: [id number]     Arrival date & time 02/02/16  1632 History   First MD Initiated Contact with Patient 02/02/16 1958     Chief Complaint  Patient presents with  . Chest Pain   Patient is a 35 y.o. female presenting with chest pain.  Chest Pain Pain location:  L chest Pain quality: sharp and tightness   Pain radiates to:  L arm Pain severity now: sometimes moderate, sometimes severe. Duration:  2 days Timing:  Constant Chronicity:  New Worsened by:  Movement Associated symptoms: shortness of breath   Associated symptoms: no abdominal pain, no cough, no fever, no lower extremity edema and not vomiting   Risk factors: smoking   Risk factors: no birth control, no coronary artery disease, no diabetes mellitus, no high cholesterol, no hypertension and no prior DVT/PE     Past Medical History  Diagnosis Date  . Anxiety   . Depression   . Headache(784.0)   . Back pain    Past Surgical History  Procedure Laterality Date  . Cesarean section    . Dilation and curettage of uterus     Family History  Problem Relation Age of Onset  . Cancer Other   . Diabetes Mother   . Hypertension Mother    Social History  Substance Use Topics  . Smoking status: Current Every Day Smoker -- 1.00 packs/day for 10 years    Types: Cigarettes  . Smokeless tobacco: Never Used  . Alcohol Use: No   OB History    Gravida Para Term Preterm AB TAB SAB Ectopic Multiple Living   Review of Systems  Constitutional: Negative for fever.  Respiratory: Positive for shortness of breath. Negative for cough.   Cardiovascular: Positive for chest pain.  Gastrointestinal: Negative for vomiting and abdominal pain.  All other systems reviewed and are negative.     Allergies  Amoxicillin and Penicillins  Home Medications   Prior to Admission medications   Medication Sig Start Date End Date Taking? Authorizing Provider  naproxen (NAPROSYN) 500 MG tablet Take 1 tablet (500 mg total) by  mouth 2 (two) times daily with a meal. As needed for pain 02/02/16   Linwood Dibbles, MD  ondansetron (ZOFRAN ODT) 4 MG disintegrating tablet Take 1 tablet (4 mg total) by mouth every 8 (eight) hours as needed for nausea or vomiting. 12/24/13   Floydene Flock, MD   BP 138/92 mmHg  Pulse 83  Temp(Src) 98.4 F (36.9 C) (Oral)  Resp 18  Ht  (1.6 m)  Wt 58.968 kg  BMI 23.03 kg/m2  SpO2 100%  LMP 02/01/2016 Physical Exam  ED Course  Procedures (including critical care time) Labs Review Labs Reviewed  CBC WITH DIFFERENTIAL/PLATELET  BASIC METABOLIC PANEL  TROPONIN I  D-DIMER, QUANTITATIVE (NOT AT [facility])    Imaging Review Dg Chest 2 View  02/02/2016  CLINICAL DATA:  Left chest pain. EXAM: CHEST  2 VIEW COMPARISON:  None. FINDINGS: Normal heart size. Normal mediastinal contour. No pneumothorax. No pleural effusion. Lungs appear clear, with no acute consolidative airspace disease and no pulmonary edema. IMPRESSION: No active cardiopulmonary disease. Electronically Signed   By: Delbert Phenix M.D.   On: 02/02/2016 16:52   I have personally reviewed and evaluated these images and lab results as part of my medical decision-making.   EKG Interpretation   Date/Time:  Monday February 02 2016 16:39:45 EDT Ventricular Rate:  99 PR Interval:  112 QRS Duration: 74 QT Interval:  338 QTC Calculation: 433 R Axis:   90 Text Interpretation:  Normal sinus rhythm Confirmed by MESNER MD, Barbara CowerJASON  [phone] on 02/02/2016 4:44:32 PM      MDM   Final diagnoses:  Chest pain, unspecified chest pain type    Heart Score 1.  No risk factors other than smoking.  Sx constant for over 24 hours and the troponin is normal.  Doubt ACS  No PE risk factors.  D dimer negative.  Doubt PE  ?pericarditisi.  Will dc home with nsaids.  Follow up with PCP    Linwood DibblesJon Shafer Swamy, MD 02/02/16 2104

## 2016-05-22 ENCOUNTER — Encounter (HOSPITAL_COMMUNITY): Payer: Self-pay | Admitting: Emergency Medicine

## 2016-05-22 ENCOUNTER — Emergency Department (HOSPITAL_COMMUNITY)
Admission: EM | Admit: 2016-05-22 | Discharge: 2016-05-22 | Disposition: A | Discharge status: Home / Self Care | Payer: Medicaid Other | Attending: Emergency Medicine | Admitting: Emergency Medicine

## 2016-05-22 DIAGNOSIS — F329 Major depressive disorder, single episode, unspecified: Secondary | ICD-10-CM | POA: Diagnosis not present

## 2016-05-22 DIAGNOSIS — L02214 Cutaneous abscess of groin: Secondary | ICD-10-CM | POA: Insufficient documentation

## 2016-05-22 DIAGNOSIS — L0291 Cutaneous abscess, unspecified: Secondary | ICD-10-CM

## 2016-05-22 DIAGNOSIS — F17210 Nicotine dependence, cigarettes, uncomplicated: Secondary | ICD-10-CM | POA: Insufficient documentation

## 2016-05-22 DIAGNOSIS — Z791 Long term (current) use of non-steroidal anti-inflammatories (NSAID): Secondary | ICD-10-CM | POA: Insufficient documentation

## 2016-05-22 LAB — POC URINE PREG, ED: PREG TEST UR: NEGATIVE

## 2016-05-22 MED ORDER — SULFAMETHOXAZOLE-TRIMETHOPRIM 800-160 MG PO TABS: 1.0000 | ORAL_TABLET | Freq: Two times a day (BID) | ORAL | Status: AC

## 2016-05-22 MED ORDER — TRAMADOL HCL 50 MG PO TABS: 50.0000 mg | ORAL_TABLET | Freq: Four times a day (QID) | ORAL | Status: DC | PRN

## 2016-05-22 MED ORDER — MUPIROCIN 2 % EX OINT: TOPICAL_OINTMENT | CUTANEOUS | Status: DC

## 2016-05-22 NOTE — ED Notes (Signed)
Pt made aware to return if symptoms worsen or if any life threatening symptoms occur.   

## 2016-05-22 NOTE — Discharge Instructions (Signed)
Abscess °An abscess (boil or furuncle) is an infected area on or under the skin. This area is filled with yellowish-white fluid (pus) and other material (debris). °HOME CARE  °· Only take medicines as told by your doctor. °· If you were given antibiotic medicine, take it as directed. Finish the medicine even if you start to feel better. °· If gauze is used, follow your doctor's directions for changing the gauze. °· To avoid spreading the infection: °¨ Keep your abscess covered with a bandage. °¨ Wash your hands well. °¨ Do not share personal care items, towels, or whirlpools with others. °¨ Avoid skin contact with others. °· Keep your skin and clothes clean around the abscess. °· Keep all doctor visits as told. °GET HELP RIGHT AWAY IF:  °· You have more pain, puffiness (swelling), or redness in the wound site. °· You have more fluid or blood coming from the wound site. °· You have muscle aches, chills, or you feel sick. °· You have a fever. °MAKE SURE YOU:  °· Understand these instructions. °· Will watch your condition. °· Will get help right away if you are not doing well or get worse. °  °This information is not intended to replace advice given to you by your health care provider. Make sure you discuss any questions you have with your health care provider. °  °Document Released: 04/05/2008 Document Revised: 04/18/2012 Document Reviewed: 01/01/2012 °Elsevier Interactive Patient Education ©2016 [employer] ° °

## 2016-05-22 NOTE — ED Notes (Signed)
Cyst to right groin area (public).  C/o pain 8/10 to site.  Area is red and tender to touch.

## 2016-05-22 NOTE — ED Provider Notes (Signed)
CSN: [id number]     Arrival date & time 05/22/16  1245 History   First MD Initiated Contact with Patient 05/22/16 1400     Chief Complaint  Patient presents with  . Cyst     (Consider location/radiation/quality/duration/timing/severity/associated sxs/prior Treatment) HPI  Christina Nelson is a 35 y.o. female who presents to the Emergency Department complaining of area of redness and pain to her pubic area.  Symptoms have been present for several days.  She states the pain became worse after squeezing the area one day ago.  She denies fever, chills, vaginal pain, drainage.   Past Medical History  Diagnosis Date  . Anxiety   . Depression   . Headache(784.0)   . Back pain    Past Surgical History  Procedure Laterality Date  . Cesarean section    . Dilation and curettage of uterus     Family History  Problem Relation Age of Onset  . Cancer Other   . Diabetes Mother   . Hypertension Mother    Social History  Substance Use Topics  . Smoking status: Current Every Day Smoker -- 1.00 packs/day for 10 years    Types: Cigarettes  . Smokeless tobacco: Never Used  . Alcohol Use: No   OB History    Gravida Para Term Preterm AB TAB SAB Ectopic Multiple Living   4 2  2 2  2   2      Review of Systems  Constitutional: Negative for chills and fever.  Gastrointestinal: Negative for nausea and vomiting.  Musculoskeletal: Negative for arthralgias and joint swelling.  Skin: Positive for color change.       Abscess   Hematological: Negative for adenopathy.  All other systems reviewed and are negative.     Allergies  Amoxicillin and Penicillins  Home Medications   Prior to Admission medications   Medication Sig Start Date End Date Taking? Authorizing Provider  ibuprofen (ADVIL,MOTRIN) 200 MG tablet Take 800 mg by mouth every 6 (six) hours as needed for moderate pain.   Yes Historical Provider, MD  mupirocin ointment (BACTROBAN) 2 % Apply to affected area TID x 10 days 05/22/16    Berenize Gatlin, PA-C  sulfamethoxazole-trimethoprim (BACTRIM DS,SEPTRA DS) 800-160 MG tablet Take 1 tablet by mouth 2 (two) times daily. 05/22/16 05/29/16  Tensley Wery, PA-C  traMADol (ULTRAM) 50 MG tablet Take 1 tablet (50 mg total) by mouth every 6 (six) hours as needed. 05/22/16   Chalmers Iddings, PA-C   BP 141/97 mmHg  Pulse 88  Temp(Src) 98.3 F (36.8 C) (Oral)  Resp 16  Ht 5\' 3"  (1.6 m)  Wt 61.236 kg  BMI 23.92 kg/m2  SpO2 100%  LMP 05/15/2016 Physical Exam  Constitutional: She is oriented to person, place, and time. She appears well-developed and well-nourished. No distress.  HENT:  Head: Normocephalic and atraumatic.  Cardiovascular: Normal rate, regular rhythm and normal heart sounds.   No murmur heard. Pulmonary/Chest: Effort normal and breath sounds normal. No respiratory distress.  Neurological: She is alert and oriented to person, place, and time. She exhibits normal muscle tone. Coordination normal.  Skin: Skin is warm and dry. There is erythema.  3 cm area of induration to the suprapubic region.  Mild erythema with central crusting.  No fluctuance.  Nursing note and vitals reviewed.   ED Course  Procedures (including critical care time) Labs Review Labs Reviewed  POC URINE PREG, ED    Imaging Review No results found. I have personally reviewed and evaluated  these images and lab results as part of my medical decision-making.   EKG Interpretation None      MDM   Final diagnoses:  Abscess    Pt with small abscess w/o fluctuance. No indication for I&D at present.  Pt agrees to warm compresses, Rx for bactrim.  Return precautions given.      Pauline Aus, PA-C 05/23/16 2123  Lavera Guise, MD 05/24/16 [phone]

## 2016-05-24 ENCOUNTER — Ambulatory Visit: Payer: Medicaid Other | Admitting: Obstetrics and Gynecology

## 2016-11-19 ENCOUNTER — Emergency Department (HOSPITAL_COMMUNITY): Payer: Medicaid Other

## 2016-11-19 ENCOUNTER — Encounter (HOSPITAL_COMMUNITY): Payer: Self-pay | Admitting: Emergency Medicine

## 2016-11-19 ENCOUNTER — Emergency Department (HOSPITAL_COMMUNITY)
Admission: EM | Admit: 2016-11-19 | Discharge: 2016-11-19 | Disposition: A | Discharge status: Home / Self Care | Payer: Medicaid Other | Attending: Emergency Medicine | Admitting: Emergency Medicine

## 2016-11-19 DIAGNOSIS — N12 Tubulo-interstitial nephritis, not specified as acute or chronic: Secondary | ICD-10-CM | POA: Diagnosis not present

## 2016-11-19 DIAGNOSIS — F17210 Nicotine dependence, cigarettes, uncomplicated: Secondary | ICD-10-CM | POA: Diagnosis not present

## 2016-11-19 DIAGNOSIS — R1032 Left lower quadrant pain: Secondary | ICD-10-CM | POA: Diagnosis present

## 2016-11-19 LAB — URINALYSIS, ROUTINE W REFLEX MICROSCOPIC
Bilirubin Urine: NEGATIVE
GLUCOSE, UA: NEGATIVE mg/dL
Nitrite: NEGATIVE
Specific Gravity, Urine: 1.025 (ref 1.005–1.030)
pH: 6 (ref 5.0–8.0)

## 2016-11-19 LAB — I-STAT CHEM 8, ED
BUN: 13 mg/dL (ref 6–20)
Calcium, Ion: 1.21 mmol/L (ref 1.15–1.40)
Chloride: 99 mmol/L — ABNORMAL LOW (ref 101–111)
Creatinine, Ser: 0.6 mg/dL (ref 0.44–1.00)
Glucose, Bld: 81 mg/dL (ref 65–99)
HCT: 46 % (ref 36.0–46.0)
Hemoglobin: 15.6 g/dL — ABNORMAL HIGH (ref 12.0–15.0)
Potassium: 4.1 mmol/L (ref 3.5–5.1)
Sodium: 138 mmol/L (ref 135–145)
TCO2: 27 mmol/L (ref 0–100)

## 2016-11-19 LAB — URINALYSIS, MICROSCOPIC (REFLEX): Squamous Epithelial / LPF: NONE SEEN

## 2016-11-19 LAB — PREGNANCY, URINE: PREG TEST UR: NEGATIVE

## 2016-11-19 MED ORDER — SODIUM CHLORIDE 0.9 % IV BOLUS (SEPSIS)
1000.0000 mL | Freq: Once | INTRAVENOUS | Status: AC
  Administered 2016-11-19: 1000 mL via INTRAVENOUS

## 2016-11-19 MED ORDER — KETOROLAC TROMETHAMINE 30 MG/ML IJ SOLN
30.0000 mg | Freq: Once | INTRAMUSCULAR | Status: AC
  Administered 2016-11-19: 30 mg via INTRAVENOUS
  Filled 2016-11-19: qty 1

## 2016-11-19 MED ORDER — CEPHALEXIN 500 MG PO CAPS: 500.0000 mg | ORAL_CAPSULE | Freq: Four times a day (QID) | ORAL | 0 refills | Status: DC

## 2016-11-19 MED ORDER — ONDANSETRON HCL 4 MG PO TABS: 4.0000 mg | ORAL_TABLET | Freq: Four times a day (QID) | ORAL | 0 refills | Status: DC

## 2016-11-19 MED ORDER — IBUPROFEN 800 MG PO TABS: 800.0000 mg | ORAL_TABLET | Freq: Three times a day (TID) | ORAL | 0 refills | Status: DC

## 2016-11-19 MED ORDER — CEPHALEXIN 500 MG PO CAPS
500.0000 mg | ORAL_CAPSULE | Freq: Once | ORAL | Status: AC
  Administered 2016-11-19: 500 mg via ORAL
  Filled 2016-11-19: qty 1

## 2016-11-19 NOTE — Discharge Instructions (Signed)
Medications: Keflex, ibuprofen, Zofran  Treatment: Take Keflex as prescribed for 10 days. Make sure to finish all of this medication. Take ibuprofen every 8 hours for your pain. Take Zofran every 6 hours as needed for nausea and vomiting. Make sure to drink plenty of water. Try to avoid sodas and sweet drinks.  Follow-up: Please establish care with a primary care provider by calling the number circled on your discharge paperwork. Please return to the emergency department if you develop any new or worsening symptoms.

## 2016-11-19 NOTE — ED Provider Notes (Signed)
[facility]-EMERGENCY DEPT Provider Note   CSN: [id number] Arrival date & time: 11/19/16  1050     History   Chief Complaint Chief Complaint  Patient presents with  . Urinary Tract Infection    HPI Christina Nelson is a 36 y.o. female with history of anxiety, depression, back pain who presents with a one-week history of dysuria, frequency, urgency and a one-day history of left lower quadrant and left flank pain. Patient reports she only had some pain with urination until around 4:30 this morning when she began having left lower quadrant and flank pain. Patient also reports a strong odor to her urine. Patient denies any known fevers, but states she has felt somewhat chilled. Patient denies any chest pain, shortness of breath, abdominal pain, nausea, vomiting, abnormal vaginal bleeding or discharge. Patient has not taken any medications at home for her symptoms. She does have a history of recurrent urinary tract infections, however it has not been treated for one recently. LMP 1 month ago.  HPI  Past Medical History:  Diagnosis Date  . Anxiety   . Back pain   . Depression   . Headache(784.0)     There are no active problems to display for this patient.   Past Surgical History:  Procedure Laterality Date  . CESAREAN SECTION    . DILATION AND CURETTAGE OF UTERUS      OB History    Gravida Para Term Preterm AB Living   4 2   2 2 2    SAB TAB Ectopic Multiple Live Births   2               Home Medications    Prior to Admission medications   Medication Sig Start Date End Date Taking? Authorizing Provider  cephALEXin (KEFLEX) 500 MG capsule Take 1 capsule (500 mg total) by mouth 4 (four) times daily. 11/19/16   Emi Holes, PA-C  ibuprofen (ADVIL,MOTRIN) 800 MG tablet Take 1 tablet (800 mg total) by mouth 3 (three) times daily. 11/19/16   Emi Holes, PA-C  mupirocin ointment (BACTROBAN) 2 % Apply to affected area TID x 10 days Patient not taking: Reported on 11/19/2016  05/22/16   Tammy Triplett, PA-C  ondansetron (ZOFRAN) 4 MG tablet Take 1 tablet (4 mg total) by mouth every 6 (six) hours. 11/19/16   Emi Holes, PA-C  traMADol (ULTRAM) 50 MG tablet Take 1 tablet (50 mg total) by mouth every 6 (six) hours as needed. Patient not taking: Reported on 11/19/2016 05/22/16   Pauline Aus, PA-C    Family History Family History  Problem Relation Age of Onset  . Diabetes Mother   . Hypertension Mother   . Cancer Other     Social History Social History  Substance Use Topics  . Smoking status: Current Every Day Smoker    Packs/day: 0.50    Years: 10.00    Types: Cigarettes  . Smokeless tobacco: Never Used  . Alcohol use No     Allergies   Amoxicillin and Penicillins   Review of Systems Review of Systems  Constitutional: Negative for chills and fever.  HENT: Negative for facial swelling and sore throat.   Respiratory: Negative for shortness of breath.   Cardiovascular: Negative for chest pain.  Gastrointestinal: Positive for abdominal pain (LLQ, suprapubic). Negative for nausea and vomiting.  Genitourinary: Positive for dysuria, flank pain (L), frequency, hematuria and urgency. Negative for pelvic pain, vaginal bleeding, vaginal discharge and vaginal pain.  Musculoskeletal: Negative for back  pain.  Skin: Negative for rash and wound.  Neurological: Negative for headaches.  Psychiatric/Behavioral: The patient is not nervous/anxious.      Physical Exam Updated Vital Signs BP 126/97   Pulse 99   Temp 98.4 F (36.9 C) (Oral)   Resp 18   Ht 5\' 4"  (1.626 m)   Wt 61.2 kg   LMP 10/19/2016   SpO2 100%   BMI 23.17 kg/m   Physical Exam  Constitutional: She appears well-developed and well-nourished. No distress.  HENT:  Head: Normocephalic and atraumatic.  Mouth/Throat: Oropharynx is clear and moist. No oropharyngeal exudate.  Eyes: Conjunctivae are normal. Pupils are equal, round, and reactive to light. Right eye exhibits no discharge.  Left eye exhibits no discharge. No scleral icterus.  Neck: Normal range of motion. Neck supple. No thyromegaly present.  Cardiovascular: Normal rate, regular rhythm, normal heart sounds and intact distal pulses.  Exam reveals no gallop and no friction rub.   No murmur heard. Pulmonary/Chest: Effort normal and breath sounds normal. No stridor. No respiratory distress. She has no wheezes. She has no rales.  Abdominal: Soft. Bowel sounds are normal. She exhibits no distension. There is tenderness in the suprapubic area and left lower quadrant. There is CVA tenderness (L). There is no rebound and no guarding.    Musculoskeletal: She exhibits no edema.       Back:  Lymphadenopathy:    She has no cervical adenopathy.  Neurological: She is alert. Coordination normal.  Skin: Skin is warm and dry. No rash noted. She is not diaphoretic. No pallor.  Psychiatric: She has a normal mood and affect.  Nursing note and vitals reviewed.    ED Treatments / Results  Labs (all labs ordered are listed, but only abnormal results are displayed) Labs Reviewed  URINALYSIS, ROUTINE W REFLEX MICROSCOPIC - Abnormal; Notable for the following:       Result Value   APPearance TURBID (*)    Hgb urine dipstick LARGE (*)    Ketones, ur TRACE (*)    Protein, ur >300 (*)    Leukocytes, UA SMALL (*)    All other components within normal limits  URINALYSIS, MICROSCOPIC (REFLEX) - Abnormal; Notable for the following:    Bacteria, UA MANY (*)    All other components within normal limits  I-STAT CHEM 8, ED - Abnormal; Notable for the following:    Chloride 99 (*)    Hemoglobin 15.6 (*)    All other components within normal limits  PREGNANCY, URINE    EKG  EKG Interpretation None       Radiology Ct Renal Stone Study  Result Date: 11/19/2016 CLINICAL DATA:  Urinary urgency.  Left lower quadrant pain. EXAM: CT ABDOMEN AND PELVIS WITHOUT CONTRAST TECHNIQUE: Multidetector CT imaging of the abdomen and pelvis  was performed following the standard protocol without IV contrast. COMPARISON:  None. FINDINGS: Lower chest: No acute abnormality. Hepatobiliary: No focal liver abnormality is seen. No gallstones, gallbladder wall thickening, or biliary dilatation. Pancreas: Unremarkable. No pancreatic ductal dilatation or surrounding inflammatory changes. Spleen: Normal in size without focal abnormality. Adrenals/Urinary Tract: Adrenal glands are unremarkable. Kidneys are normal, without renal calculi, focal lesion, or hydronephrosis. Bladder is unremarkable. Stomach/Bowel: Stomach is within normal limits. Appendix appears normal. No evidence of bowel wall thickening, distention, or inflammatory changes. Vascular/Lymphatic: No significant vascular findings are present. No enlarged abdominal or pelvic lymph nodes. Reproductive: Uterus and bilateral adnexa are unremarkable. Other: No abdominal wall hernia or abnormality. No abdominopelvic ascites.  Musculoskeletal: No acute or significant osseous findings. IMPRESSION: 1. No urolithiasis or obstructive uropathy. Electronically Signed   By: Elige KoHetal  Patel   On: 11/19/2016 14:17    Procedures Procedures (including critical care time)  Medications Ordered in ED Medications  sodium chloride 0.9 % bolus 1,000 mL (0 mLs Intravenous Stopped 11/19/16 1420)  ketorolac (TORADOL) 30 MG/ML injection 30 mg (30 mg Intravenous Given 11/19/16 1340)  cephALEXin (KEFLEX) capsule 500 mg (500 mg Oral Given 11/19/16 1526)     Initial Impression / Assessment and Plan / ED Course  I have reviewed the triage vital signs and the nursing notes.  Pertinent labs & imaging results that were available during my care of the patient were reviewed by me and considered in my medical decision making (see chart for details).     Patient with probable pyelonephritis. UA shows large hematuria, small leukocytes, many bacteria, too numerous to count WBCs, protein >300, trace ketones. Urine pregnancy negative.  Renal function WNL on Chem-8. CT renal stone study shows no urolithiasis or obstructive uropathy. Patient initially tachycardic in the ED which was improved after 1 L of normal saline. Patient given first dose of Keflex in the ED. Discharge home with Keflex, Zofran, ibuprofen. Return precautions discussed. Patient understands and agrees with plan. Patient vitals stable and discharged in satisfactory condition. I discussed patient case with Dr. Clarene DukeMcManus who guided the patient's management and agrees with plan.  Final Clinical Impressions(s) / ED Diagnoses   Final diagnoses:  Pyelonephritis    New Prescriptions Discharge Medication List as of 11/19/2016  3:04 PM    START taking these medications   Details  cephALEXin (KEFLEX) 500 MG capsule Take 1 capsule (500 mg total) by mouth 4 (four) times daily., Starting Fri 11/19/2016, Print    ondansetron (ZOFRAN) 4 MG tablet Take 1 tablet (4 mg total) by mouth every 6 (six) hours., Starting Fri 11/19/2016, Print         Emi HolesAlexandra M Kalijah Westfall, PA-C 11/19/16 1538    Samuel JesterKathleen McManus, DO 11/21/16 1827

## 2016-11-19 NOTE — ED Triage Notes (Signed)
Reports several week history of UTI sx on and off-  Cloudy urine  [place]

## 2017-02-08 ENCOUNTER — Emergency Department (HOSPITAL_COMMUNITY)
Admission: EM | Admit: 2017-02-08 | Discharge: 2017-02-08 | Disposition: A | Discharge status: Home / Self Care | Payer: Medicaid Other | Attending: Emergency Medicine | Admitting: Emergency Medicine

## 2017-02-08 ENCOUNTER — Encounter (HOSPITAL_COMMUNITY): Payer: Self-pay | Admitting: Cardiology

## 2017-02-08 ENCOUNTER — Emergency Department (HOSPITAL_COMMUNITY): Payer: Medicaid Other

## 2017-02-08 DIAGNOSIS — F17210 Nicotine dependence, cigarettes, uncomplicated: Secondary | ICD-10-CM | POA: Insufficient documentation

## 2017-02-08 DIAGNOSIS — R569 Unspecified convulsions: Secondary | ICD-10-CM | POA: Insufficient documentation

## 2017-02-08 LAB — URINALYSIS, ROUTINE W REFLEX MICROSCOPIC
BILIRUBIN URINE: NEGATIVE
Bacteria, UA: NONE SEEN
Glucose, UA: NEGATIVE mg/dL
KETONES UR: 20 mg/dL — AB
LEUKOCYTES UA: NEGATIVE
Nitrite: NEGATIVE
PH: 5 (ref 5.0–8.0)
Protein, ur: 100 mg/dL — AB
Specific Gravity, Urine: 1.025 (ref 1.005–1.030)

## 2017-02-08 LAB — PREGNANCY, URINE: Preg Test, Ur: NEGATIVE

## 2017-02-08 LAB — CBC WITH DIFFERENTIAL/PLATELET
BASOS ABS: 0 10*3/uL (ref 0.0–0.1)
Basophils Relative: 0 %
EOS PCT: 1 %
Eosinophils Absolute: 0 10*3/uL (ref 0.0–0.7)
HEMATOCRIT: 32.5 % — AB (ref 36.0–46.0)
HEMOGLOBIN: 11.3 g/dL — AB (ref 12.0–15.0)
LYMPHS PCT: 25 %
Lymphs Abs: 1.7 10*3/uL (ref 0.7–4.0)
MCH: 30.6 pg (ref 26.0–34.0)
MCHC: 34.8 g/dL (ref 30.0–36.0)
MCV: 88.1 fL (ref 78.0–100.0)
Monocytes Absolute: 0.4 10*3/uL (ref 0.1–1.0)
Monocytes Relative: 6 %
NEUTROS ABS: 4.8 10*3/uL (ref 1.7–7.7)
NEUTROS PCT: 68 %
PLATELETS: 255 10*3/uL (ref 150–400)
RBC: 3.69 MIL/uL — AB (ref 3.87–5.11)
RDW: 12.5 % (ref 11.5–15.5)
WBC: 7.1 10*3/uL (ref 4.0–10.5)

## 2017-02-08 LAB — RAPID URINE DRUG SCREEN, HOSP PERFORMED
Amphetamines: NOT DETECTED
BARBITURATES: NOT DETECTED
Benzodiazepines: NOT DETECTED
Cocaine: NOT DETECTED
OPIATES: NOT DETECTED
Tetrahydrocannabinol: POSITIVE — AB

## 2017-02-08 LAB — COMPREHENSIVE METABOLIC PANEL
ALBUMIN: 3.4 g/dL — AB (ref 3.5–5.0)
ALT: 12 U/L — ABNORMAL LOW (ref 14–54)
ANION GAP: 10 (ref 5–15)
AST: 16 U/L (ref 15–41)
Alkaline Phosphatase: 76 U/L (ref 38–126)
BUN: 9 mg/dL (ref 6–20)
CHLORIDE: 100 mmol/L — AB (ref 101–111)
CO2: 25 mmol/L (ref 22–32)
Calcium: 8.7 mg/dL — ABNORMAL LOW (ref 8.9–10.3)
Creatinine, Ser: 0.44 mg/dL (ref 0.44–1.00)
Glucose, Bld: 96 mg/dL (ref 65–99)
POTASSIUM: 3.1 mmol/L — AB (ref 3.5–5.1)
SODIUM: 135 mmol/L (ref 135–145)
Total Bilirubin: 0.4 mg/dL (ref 0.3–1.2)
Total Protein: 7.1 g/dL (ref 6.5–8.1)

## 2017-02-08 LAB — LIPASE, BLOOD: LIPASE: 20 U/L (ref 11–51)

## 2017-02-08 MED ORDER — LEVETIRACETAM 500 MG PO TABS: 500.0000 mg | ORAL_TABLET | Freq: Two times a day (BID) | ORAL | 0 refills | Status: DC

## 2017-02-08 NOTE — Discharge Instructions (Signed)
You have been seen in the emergency department today for a likely seizure.  Your workup today including labs are within normal limits.  Please follow up with your doctor as soon as possible regarding today's emergency department visit and your likely seizure.  You will also need to follow up with a neurologist as soon as possible, please call for appointment.  If you have been prescribed a medication for your seizures, please take this medication as prescribed.  As we have discussed it is very important that you DO NOT drive until you have been seen and cleared by your neurologist.  Please drink plenty of fluids, get plenty of sleep and avoid any alcohol or drug use.  Return to the emergency department if you have any further seizures, develop any weakness/numbness of any arm/leg, confusion, slurred speech, or sudden/severe headache.  

## 2017-02-08 NOTE — ED Triage Notes (Signed)
Pt states she had an episode this morning where she was shaking.  States she fell down the steps this morning.  Episode before the shaking with no memory.  States she sent her kids to school and does not remember anything.

## 2017-02-08 NOTE — ED Provider Notes (Signed)
Emergency Department Provider Note   I have reviewed the triage vital signs and the nursing notes.   HISTORY  Chief Complaint Seizures   HPI Christina Nelson is a 36 y.o. female with PMH of depression, anxiety, and chronic back pain since to the emergency department for evaluation of questionable seizure activity. Patient had an event today where she was walking down the steps and suddenly fell. She recalls possibly hitting her head and the right side of her chest wall. She believes that she got up relatively quickly but then cannot account for events later in the day. She's had 2 similar episodes in the last 3 months. The first 2 episodes were observed by her fianc described as seizure-like activity. The episode was 3 weeks ago and reportedly lasted throughout the night. Patient did not present to the hospital or outpatient physician at that time. She denies any associated fever or chills. No HA or neck stiffness. She is on Suboxone and her physician there recommended outpatient MRI in late March but the patient did not follow up. She reports some tongue biting during both episodes previously. She initially had some gait instability and drowsiness but that seems to have resolved at this time. He was able to call a family member who is not bedside who found her in a somewhat confused state and called EMS.   Past Medical History:  Diagnosis Date  . Anxiety   . Back pain   . Depression   . Headache(784.0)     There are no active problems to display for this patient.   Past Surgical History:  Procedure Laterality Date  . CESAREAN SECTION    . DILATION AND CURETTAGE OF UTERUS      Current Outpatient Rx  . Order #: [id number] Class: Historical Med  . Order #: [id number] Class: Historical Med  . Order #: [id number] Class: Historical Med  . Order #: [id number] Class: Historical Med  . Order #: [id number] Class: Print    Allergies Amoxicillin and Penicillins  Family History  Problem  Relation Age of Onset  . Diabetes Mother   . Hypertension Mother   . Cancer Other     Social History Social History  Substance Use Topics  . Smoking status: Current Every Day Smoker    Packs/day: 0.50    Years: 10.00    Types: Cigarettes  . Smokeless tobacco: Never Used  . Alcohol use No    Review of Systems  Constitutional: No fever/chills Eyes: No visual changes. ENT: No sore throat. Cardiovascular: Denies chest pain. Respiratory: Denies shortness of breath. Gastrointestinal: No abdominal pain.  No nausea, no vomiting.  No diarrhea.  No constipation. Genitourinary: Negative for dysuria. Musculoskeletal: Negative for back pain.  Skin: Negative for rash.  Neurological: Negative for headaches, focal weakness or numbness. Positive seizure-like activity.   10-point ROS otherwise negative.  ____________________________________________   PHYSICAL EXAM:  VITAL SIGNS: ED Triage Vitals  Enc Vitals Group     BP 02/08/17 1301 137/82     Pulse Rate 02/08/17 1301 (!) 112     Resp 02/08/17 1301 16     Temp 02/08/17 1301 99.1 F (37.3 C)     Temp Source 02/08/17 1301 Oral     SpO2 02/08/17 1301 98 %     Weight 02/08/17 1301 135 lb (61.2 kg)     Height 02/08/17 1301  (1.6 m)     Pain Score 02/08/17 1259 10   Constitutional: Alert and oriented. Well appearing and in no acute  distress. Eyes: Conjunctivae are normal. PERRL. EOMI.  Head: Atraumatic. Nose: No congestion/rhinnorhea. Mouth/Throat: Mucous membranes are moist.  Neck: No stridor.  No meningeal signs.   Cardiovascular: Tachycardia. Good peripheral circulation. Grossly normal heart sounds.   Respiratory: Normal respiratory effort.  No retractions. Lungs CTAB. Gastrointestinal: Soft and nontender. No distention.  Musculoskeletal: No lower extremity tenderness nor edema. No gross deformities of extremities. Neurologic:  Normal speech and language. No gross focal neurologic deficits are appreciated. Normal gait.    Skin:  Skin is warm, dry and intact. No rash noted. Psychiatric: Mood and affect are normal. Speech and behavior are normal.  ____________________________________________   LABS (all labs ordered are listed, but only abnormal results are displayed)  Labs Reviewed  COMPREHENSIVE METABOLIC PANEL - Abnormal; Notable for the following:       Result Value   Potassium 3.1 (*)    Chloride 100 (*)    Calcium 8.7 (*)    Albumin 3.4 (*)    ALT 12 (*)    All other components within normal limits  CBC WITH DIFFERENTIAL/PLATELET - Abnormal; Notable for the following:    RBC 3.69 (*)    Hemoglobin 11.3 (*)    HCT 32.5 (*)    All other components within normal limits  URINALYSIS, ROUTINE W REFLEX MICROSCOPIC - Abnormal; Notable for the following:    Hgb urine dipstick SMALL (*)    Ketones, ur 20 (*)    Protein, ur 100 (*)    Squamous Epithelial / LPF 0-5 (*)    All other components within normal limits  RAPID URINE DRUG SCREEN, HOSP PERFORMED - Abnormal; Notable for the following:    Tetrahydrocannabinol POSITIVE (*)    All other components within normal limits  LIPASE, BLOOD  PREGNANCY, URINE   ____________________________________________  EKG   EKG Interpretation  Date/Time:  Tuesday February 08 2017 13:01:11 EDT Ventricular Rate:  110 PR Interval:  130 QRS Duration: 78 QT Interval:  318 QTC Calculation: 430 R Axis:   72 Text Interpretation:  Sinus tachycardia Otherwise normal ECG Confirmed by LIU MD, DANA [phone]) on 02/08/2017 1:20:17 PM       ____________________________________________  RADIOLOGY  Dg Chest 2 View  Result Date: 02/08/2017 CLINICAL DATA:  Seizure today.  Fell down steps. EXAM: CHEST  2 VIEW COMPARISON:  02/02/2016 FINDINGS: Heart size is normal. Mediastinal shadows are normal. The lungs are clear. Chronic spinal curvature and degenerative change. IMPRESSION: No active cardiopulmonary disease. Spinal curvature in degenerative change. Electronically Signed    By: Paulina Fusi M.D.   On: 02/08/2017 20:15   Ct Head Wo Contrast  Result Date: 02/08/2017 CLINICAL DATA:  The patient experienced an episode of shaking today. Subsequent fall down stairs with a blow to the right side of the head. Initial encounter. EXAM: CT HEAD WITHOUT CONTRAST TECHNIQUE: Contiguous axial images were obtained from the base of the skull through the vertex without intravenous contrast. COMPARISON:  None. FINDINGS: Brain: Appears normal without hemorrhage, infarct, mass lesion, mass effect, midline shift or abnormal extra-axial fluid collection. No hydrocephalus or pneumocephalus. Vascular: Negative. Skull: Intact. Sinuses/Orbits: Minimal mucosal thickening right sphenoid sinus is noted. Other: None. IMPRESSION: Negative head CT. Electronically Signed   By: Drusilla Kanner M.D.   On: 02/08/2017 20:25    ____________________________________________   PROCEDURES  Procedure(s) performed:   Procedures  None ____________________________________________   INITIAL IMPRESSION / ASSESSMENT AND PLAN / ED COURSE  Pertinent labs & imaging results that were available during my care  of the patient were reviewed by me and considered in my medical decision making (see chart for details).  She presents to the emergency department for evaluation of several episodes of seizure-like activity over the past 2 months. No fevers or chills. No signs or symptoms of meningitis. Patient's history today is somewhat atypical for seizures but she may have been found in a postictal state. Denies any drug or alcohol abuse. He shouldn't is on Suboxone. She has no focal neurological deficits on my exam today. She seems appropriate with no evidence of head or chest wall trauma. Given no history of prior seizure disorder plan for CT scan of the head along with baseline labs. Plan for neurology follow up as an outpatient if negative.   08:38 PM Spoke with Dr. Gerilyn Pilgrim who recommends starting Keppra 500 mg  BID and have her follow up in the office with him for further evaluation. Discussed plan with the patient including precautions to not drive a car until evaluated and cleared by neurology to do so. Patient reports that she doesn't drive.   At this time, I do not feel there is any life-threatening condition present. I have reviewed and discussed all results (EKG, imaging, lab, urine as appropriate), exam findings with patient. I have reviewed nursing notes and appropriate previous records.  I feel the patient is safe to be discharged home without further emergent workup. Discussed usual and customary return precautions. Patient and family (if present) verbalize understanding and are comfortable with this plan.  Patient will follow-up with their primary care provider. If they do not have a primary care provider, information for follow-up has been provided to them. All questions have been answered.  ____________________________________________  FINAL CLINICAL IMPRESSION(S) / ED DIAGNOSES  Final diagnoses:  Seizure-like activity (HCC)     MEDICATIONS GIVEN DURING THIS VISIT:  None  NEW OUTPATIENT MEDICATIONS STARTED DURING THIS VISIT:  Discharge Medication List as of 02/08/2017  9:16 PM    START taking these medications   Details  levETIRAcetam (KEPPRA) 500 MG tablet Take 1 tablet (500 mg total) by mouth 2 (two) times daily., Starting Tue 02/08/2017, Until Thu 03/10/2017, Print        Note:  This document was prepared using Dragon voice recognition software and may include unintentional dictation errors.  Alona Bene, MD Emergency Medicine   Maia Plan, MD 02/08/17 [phone]

## 2017-02-08 NOTE — ED Notes (Signed)
Topaz pad not working. Pt verbalized understanding of discharge instructions. Pt ambulatory to waiting room.

## 2017-04-20 ENCOUNTER — Other Ambulatory Visit (HOSPITAL_COMMUNITY): Payer: Self-pay | Admitting: Osteopathic Medicine

## 2017-04-20 DIAGNOSIS — G4089 Other seizures: Secondary | ICD-10-CM

## 2017-04-28 ENCOUNTER — Ambulatory Visit (HOSPITAL_COMMUNITY): Payer: Medicaid Other

## 2017-05-31 ENCOUNTER — Encounter: Payer: Self-pay | Admitting: Family Medicine

## 2017-05-31 ENCOUNTER — Ambulatory Visit (INDEPENDENT_AMBULATORY_CARE_PROVIDER_SITE_OTHER): Payer: Medicaid Other | Admitting: Family Medicine

## 2017-05-31 DIAGNOSIS — Z720 Tobacco use: Secondary | ICD-10-CM | POA: Diagnosis not present

## 2017-05-31 DIAGNOSIS — F1911 Other psychoactive substance abuse, in remission: Secondary | ICD-10-CM

## 2017-05-31 DIAGNOSIS — R569 Unspecified convulsions: Secondary | ICD-10-CM

## 2017-05-31 DIAGNOSIS — F418 Other specified anxiety disorders: Secondary | ICD-10-CM | POA: Diagnosis not present

## 2017-05-31 NOTE — Progress Notes (Signed)
Chief Complaint  Patient presents with  . Annual Exam    referral for MRi for cause of seizure   New patient No PCP Sees counselor for addiction.  Is in suboxone program with regular screening. Went into this program last fall and her last relapse using opioids was 2-3 months ago.  Continues to smoke MJ.  Continues to smoke cigarettes. Complains of depression and anxiety.  Untreated because of seizure like activity.  Has had 4 spells.  Cannot recall events exactly.  They last for hours.  She apparently can speak during these spells.  Seen in ER in April.  Dr Gerilyn Pilgrimoonquah was consulted and she was started on Keppra.  Has taken in inconsistently.  Has not been to see him in consult.  Does not seem aware that she was supposed to call him for appt.  I told her it was printed on her after visit summary.  She takes seroquel as needed for sleep.  She is "afraid to take depression medicine" on the seizure medicine. She is on a high dose of gabapentin, 600 TID and 1000 at HS.  This is from her pain provider.  I feel it is excessive and unsafe.  I have advised her to reduce the medicine to 600 QID Over due PAP, over due imunizations, no preventative care   Patient Active Problem List   Diagnosis Date Noted  . 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 Tobacco abuse 05/31/2017  . Seizure-like activity (HCC) 05/31/2017  . Anxiety with depression 05/31/2017    Outpatient Encounter Prescriptions as of 05/31/2017  Medication Sig  . buprenorphine-naloxone (SUBOXONE) 8-2 mg SUBL SL tablet Place 1 tablet under the tongue every 12 (twelve) hours.  . gabapentin (NEURONTIN) 300 MG capsule Take 600-3,000 mg by mouth 3 (three) times daily with meals. 600mg  three times daily with meals and 10 capsules daily at bedtime   . ibuprofen (ADVIL,MOTRIN) 200 MG tablet Take 200 mg by mouth every 6 (six) hours as needed for mild pain or moderate pain.  Marland Kitchen. QUEtiapine (SEROQUEL XR) 50 MG TB24 24 hr tablet Take 50 mg by mouth  every morning.  . levETIRAcetam (KEPPRA) 500 MG tablet Take 1 tablet (500 mg total) by mouth 2 (two) times daily.   No facility-administered encounter medications on file as of 05/31/2017.     Past Medical History:  Diagnosis Date  . Allergy   . Anxiety   . Back pain   . Depression   . Headache(784.0)   . Migraine   . Seizures (HCC)   . Substance abuse     Past Surgical History:  Procedure Laterality Date  . CESAREAN SECTION    . DILATION AND CURETTAGE OF UTERUS      Social History   Social History  . Marital status: Significant Other    Spouse name: Alycia RossettiRyan  . Number of children: 2  . Years of education: 8   Occupational History  . unemployed    Social History Main Topics  . Smoking status: Current Every Day Smoker    Packs/day: 1.00    Years: 10.00    Types: Cigarettes  . Smokeless tobacco: Never Used  . Alcohol use No  . Drug use: No     Comment: past history- pain pills, marijuana - last use May 2018  . Sexual activity: Yes    Birth control/ protection: Condom   Other Topics Concern  . Not on file   Social History Narrative   Lives in apartment  Lives with 2 sons   Alycia RossettiRyan stays there most of the time   He is recovered addict   Spends leisure time with children, sleeps, watches TV    Family History  Problem Relation Age of Onset  . Diabetes Mother   . Hypertension Mother   . Hyperlipidemia Mother   . Depression Mother   . Cancer Other   . Asthma Son   . Asthma Son   . Birth defects Son     Review of Systems  Constitutional: Positive for malaise/fatigue.       "sleep all the time"  HENT: Negative for hearing loss and tinnitus.   Eyes: Negative for blurred vision and double vision.  Respiratory: Negative for shortness of breath and wheezing.   Cardiovascular: Negative for chest pain and palpitations.  Gastrointestinal: Negative for constipation and diarrhea.  Genitourinary: Negative for dysuria and frequency.       Irregular menses    Musculoskeletal: Positive for back pain and neck pain.       Chronic  Neurological: Positive for dizziness, speech change, seizures and weakness.       Spells of loss of awareness, balance impairment, falls  Psychiatric/Behavioral: Positive for depression, memory loss and substance abuse. The patient is nervous/anxious and has insomnia.     BP 110/78 (BP Location: Left Arm, Patient Position: Sitting, Cuff Size: Normal)   Pulse 85   Temp 97.9 F (36.6 C) (Other (Comment))   Resp 16   Ht 5\' 3"  (1.6 m)   Wt 150 lb 8 oz (68.3 kg)   LMP  (LMP Unknown)   SpO2 97%   BMI 26.66 kg/m   Physical Exam  Constitutional: She is oriented to person, place, and time. She appears well-developed and well-nourished.  Smells of tobacco  HENT:  Head: Normocephalic and atraumatic.  Mouth/Throat: Oropharynx is clear and moist.  Eyes: Pupils are equal, round, and reactive to light.  Neck: Normal range of motion. No thyromegaly present.  Cardiovascular: Normal rate, regular rhythm and normal heart sounds.   Pulmonary/Chest: Effort normal and breath sounds normal.  Neurological: She is alert and oriented to person, place, and time.  Moves comfortably, normal gait  Psychiatric: She has a normal mood and affect. Her behavior is normal.  Poor judgement.  NO motivation.   ASSESSMENT/PLAN:   1. Substance abuse in remission History of poor comlpliance   - Ambulatory referral to Gynecology- importance of birth control emphasized  2. Tobacco abuse Not willing to quit  3. Seizure-like activity (HCC) doubt seizures.  Concern about medicine dose and reactions.  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 Anxiety with depression untreated Greater than 50% of this visit was spent in counseling and coordinating care.  Total face to face time:  45 min.  Seen with  Mother.  Counseled extensively on diet, nutrition, smoking cessation, lifestyle.  Dangers of medicine use  especially combined with MJ.  Danger of pregnancy right now to mother and baby   Patient Instructions  Need to see Dr Gerilyn Pilgrimoonquah  Need to see Dr Emelda FearFerguson  Get fresh air every day that you are able Work on nutrition, fruits and vegetables  See me in a couple of weeks  Call sooner for problems   Eustace MooreYvonne Sue Divante Kotch, MD

## 2017-05-31 NOTE — Patient Instructions (Addendum)
Need to see Dr Gerilyn Pilgrimoonquah  Need to see Dr Emelda FearFerguson  Get fresh air every day that you are able Work on nutrition, fruits and vegetables  Reduce the gabapentin to 600 mg 4 times a day  See me in a couple of weeks  Call sooner for problems

## 2017-06-14 ENCOUNTER — Ambulatory Visit: Payer: Medicaid Other | Admitting: Family Medicine

## 2017-06-15 ENCOUNTER — Encounter: Payer: Medicaid Other | Admitting: Adult Health

## 2017-07-01 ENCOUNTER — Ambulatory Visit: Payer: Medicaid Other | Admitting: Family Medicine

## 2017-10-03 ENCOUNTER — Telehealth (HOSPITAL_COMMUNITY): Payer: Self-pay

## 2017-10-03 IMAGING — CT CT RENAL STONE PROTOCOL
2 of 4 series · 16 of 46 positions shown, 18 images · non-contrast
Comparison: None.

CLINICAL DATA: Urinary urgency.  Left lower quadrant pain.

EXAM:
CT ABDOMEN AND PELVIS WITHOUT CONTRAST
TECHNIQUE: Multidetector CT imaging of the abdomen and pelvis was performed
following the standard protocol without IV contrast.

[Series 2: axial st · axial · 0.72mm/px · z∈[+842,+1257]mm · 13 of 91 slices shown, 15 images]
[im 4/91  soft-tissue]
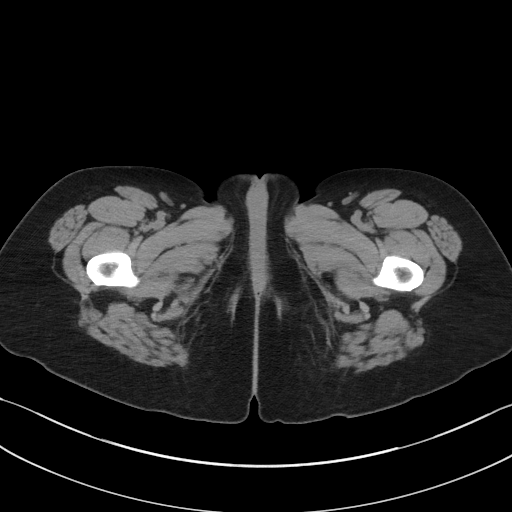
[im 4/91  bone]
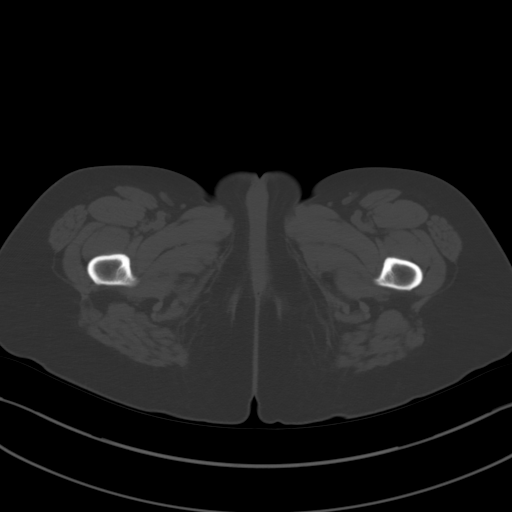
[im 11/91  soft-tissue]
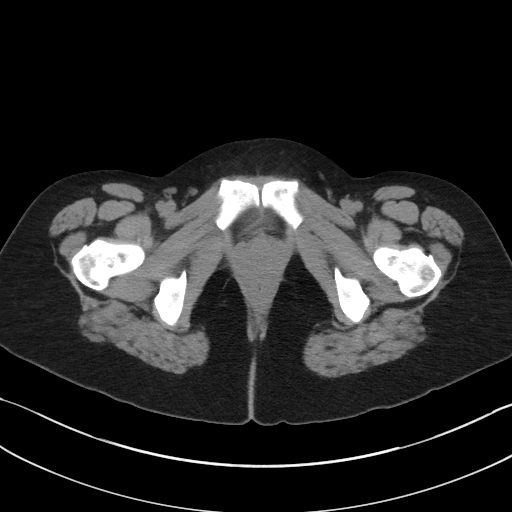
[im 19/91  soft-tissue]
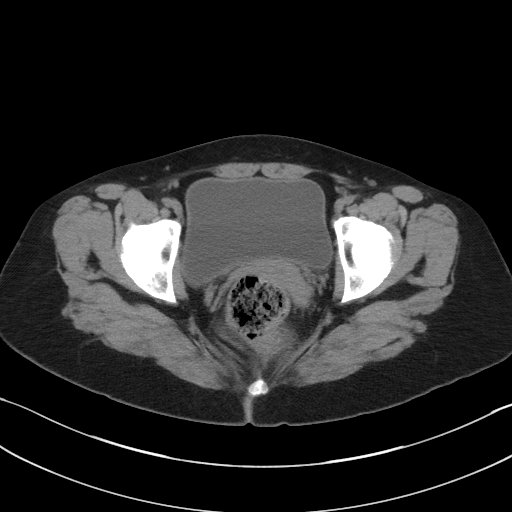
[im 26/91  soft-tissue]
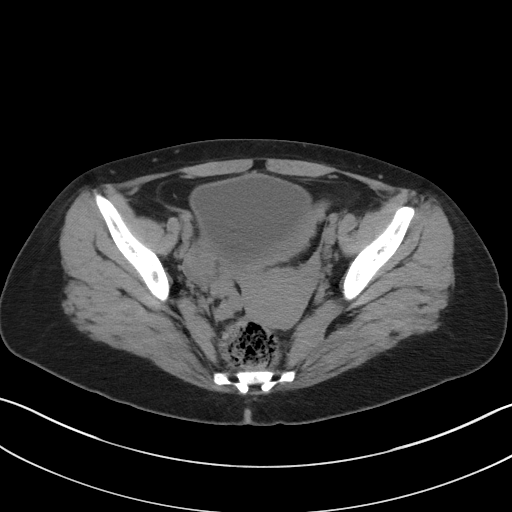
[im 33/91  soft-tissue]
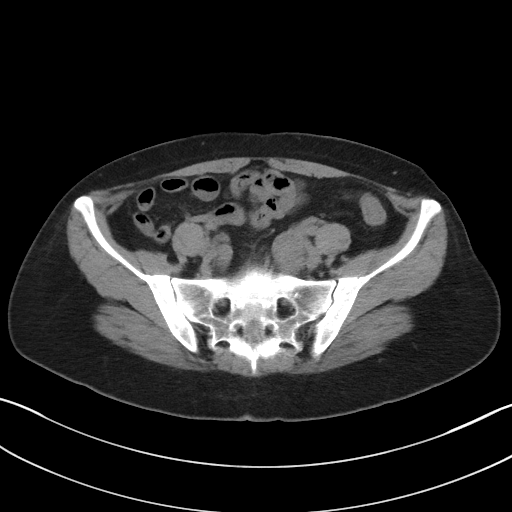
[im 40/91  soft-tissue]
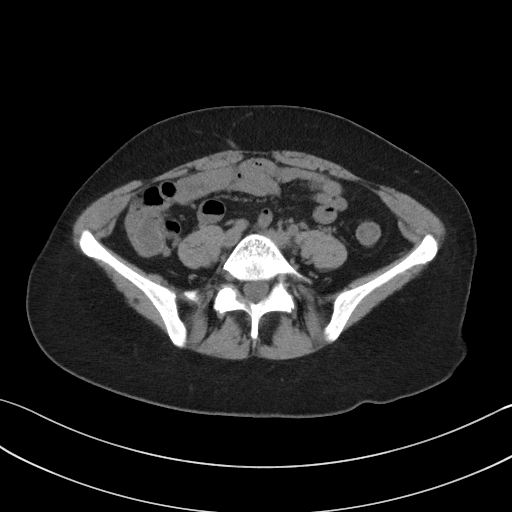
[im 47/91  soft-tissue]
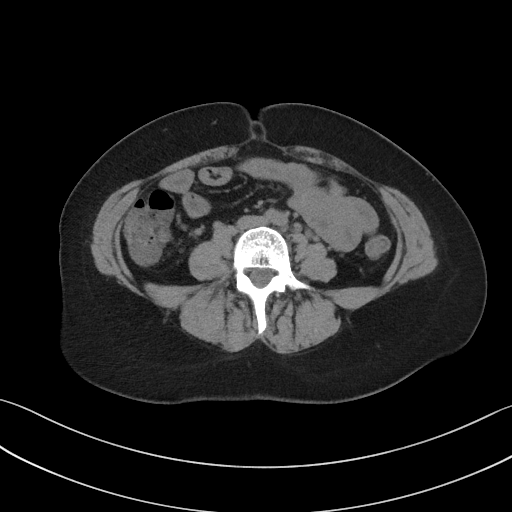
[im 51/91  soft-tissue]
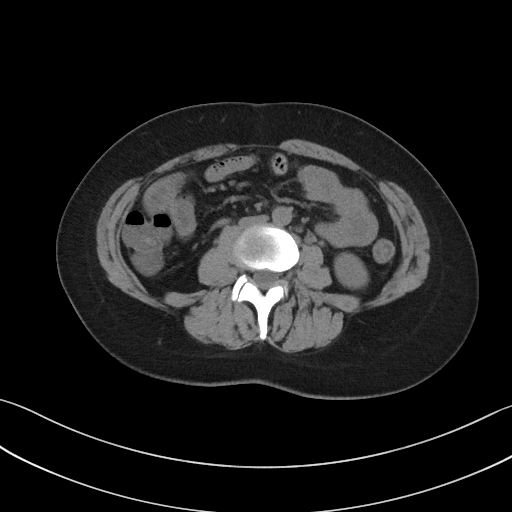
[im 58/91  soft-tissue]
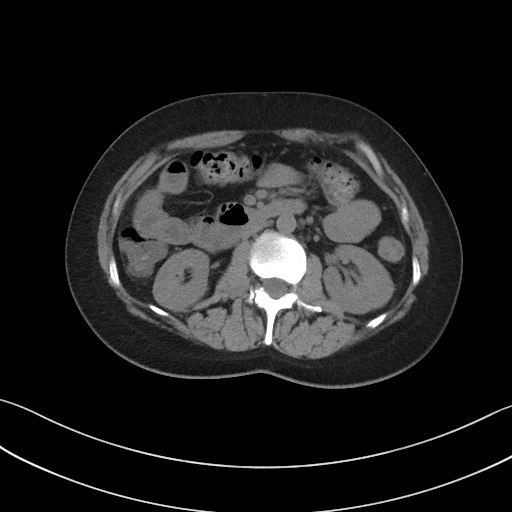
[im 58/91  bone]
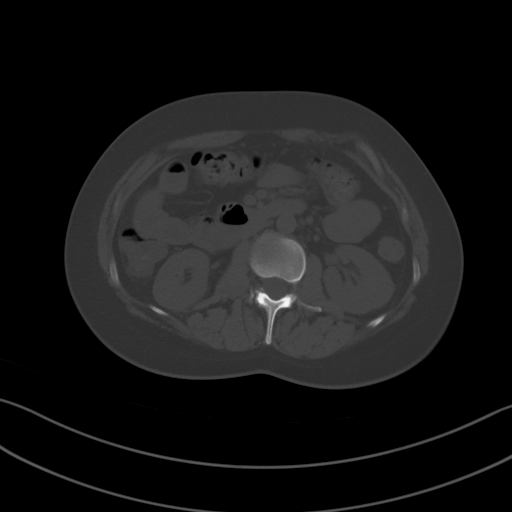
[im 65/91  soft-tissue]
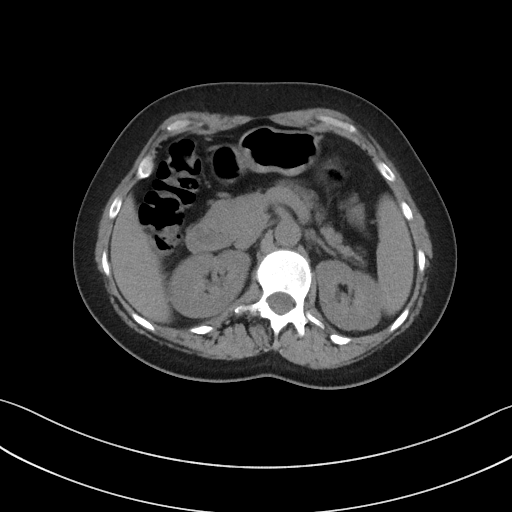
[im 73/91  soft-tissue]
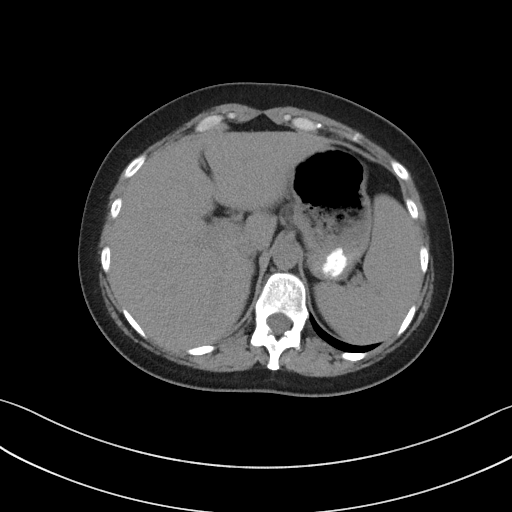
[im 80/91  soft-tissue]
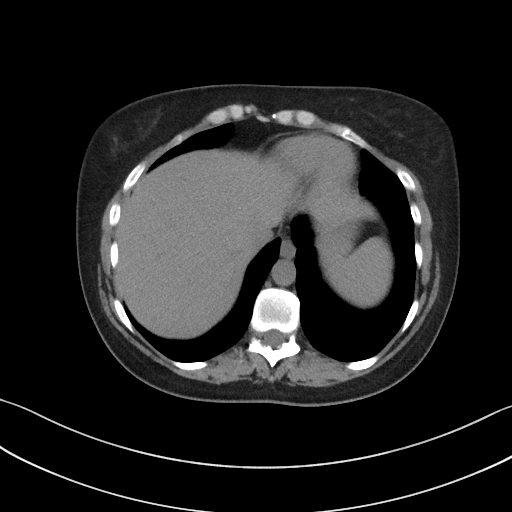
[im 87/91  soft-tissue]
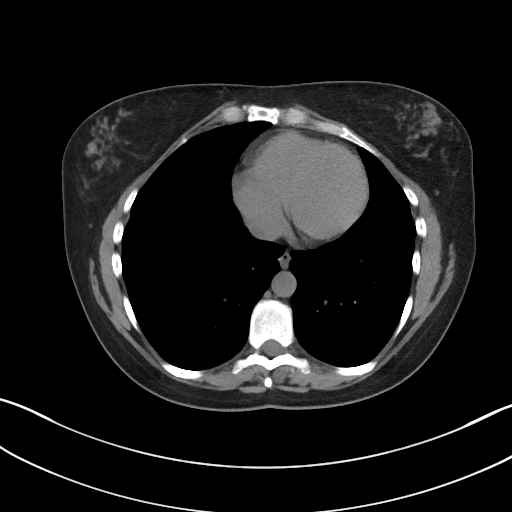

[Series 3: coronal st · coronal · 0.61mm/px · 3 of 101 slices shown]
[im 34/101  soft-tissue]
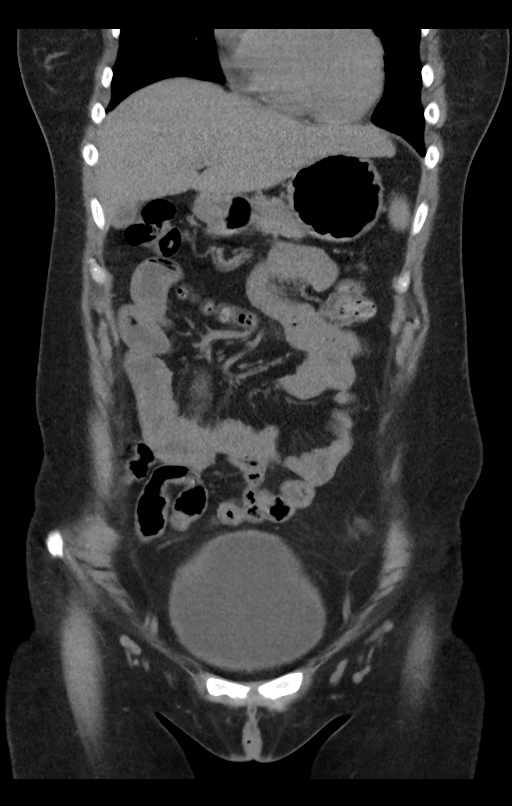
[im 45/101  soft-tissue]
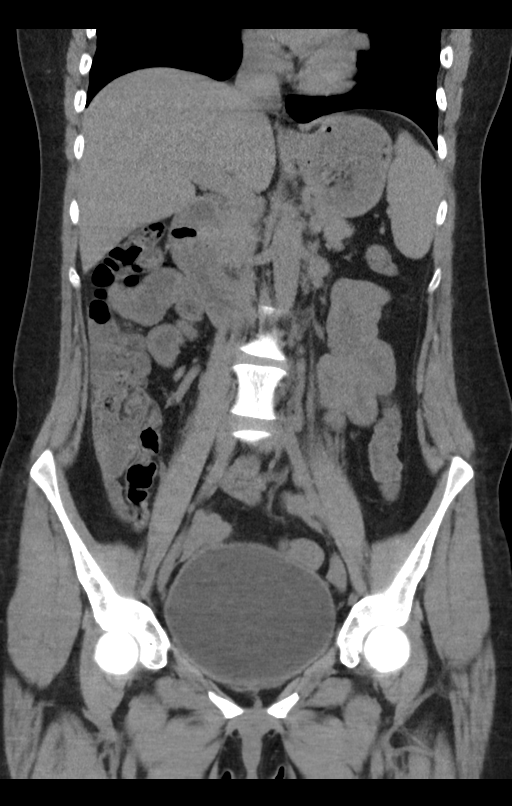
[im 56/101  soft-tissue]
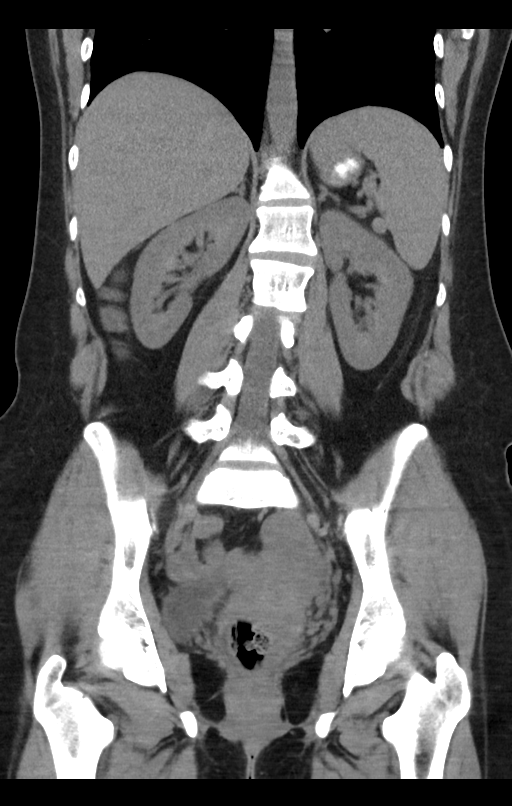

[16 of 46 positions shown; findings below may reference images not displayed]

FINDINGS: Lower chest: No acute abnormality.

Hepatobiliary: No focal liver abnormality is seen. No gallstones,
gallbladder wall thickening, or biliary dilatation.

Pancreas: Unremarkable. No pancreatic ductal dilatation or
surrounding inflammatory changes.

Spleen: Normal in size without focal abnormality.

Adrenals/Urinary Tract: Adrenal glands are unremarkable. Kidneys are
normal, without renal calculi, focal lesion, or hydronephrosis.
Bladder is unremarkable.

Stomach/Bowel: Stomach is within normal limits. Appendix appears
normal. No evidence of bowel wall thickening, distention, or
inflammatory changes.

Vascular/Lymphatic: No significant vascular findings are present. No
enlarged abdominal or pelvic lymph nodes.

Reproductive: Uterus and bilateral adnexa are unremarkable.

Other: No abdominal wall hernia or abnormality. No abdominopelvic
ascites.

Musculoskeletal: No acute or significant osseous findings.
IMPRESSION: 1. No urolithiasis or obstructive uropathy.

## 2017-10-03 NOTE — Telephone Encounter (Signed)
[facility] Follow up phone call:  Christina Nelson reports that she is doing well.  Christina Nelson receives counseling and psychiatric medication management [facility] ([facility] [phone]).  Christina Nelson reports that she has been receiving these services for the past year.   Christina Nelson report that transportation is an issue and she prefers to receive group counseling and the psychiatric medication form the [facility]  Devery denies SI/HI/Psychosis.  Christina Nelson reports that she is compliant with taking her medication and has not relapsed. Christina Nelson reports that she has been clean since being in the Sub[facility] clinic.

## 2019-11-13 ENCOUNTER — Encounter: Payer: Self-pay | Admitting: Neurology

## 2019-12-10 ENCOUNTER — Ambulatory Visit (INDEPENDENT_AMBULATORY_CARE_PROVIDER_SITE_OTHER): Payer: Medicaid Other | Admitting: Neurology

## 2019-12-10 ENCOUNTER — Encounter: Payer: Self-pay | Admitting: Neurology

## 2019-12-10 ENCOUNTER — Other Ambulatory Visit: Payer: Self-pay

## 2019-12-10 VITALS — BP 117/82 | HR 86 | Ht 64.0 in | Wt 179.4 lb

## 2019-12-10 DIAGNOSIS — G40009 Localization-related (focal) (partial) idiopathic epilepsy and epileptic syndromes with seizures of localized onset, not intractable, without status epilepticus: Secondary | ICD-10-CM | POA: Diagnosis not present

## 2019-12-10 DIAGNOSIS — R9089 Other abnormal findings on diagnostic imaging of central nervous system: Secondary | ICD-10-CM | POA: Diagnosis not present

## 2019-12-10 MED ORDER — LEVETIRACETAM 500 MG PO TABS: ORAL_TABLET | ORAL | 11 refills | Status: DC

## 2019-12-10 NOTE — Progress Notes (Signed)
NEUROLOGY CONSULTATION NOTE  Christina Nelson MRN: [id number] DOB: 1980-12-07  Referring provider: Dr. Annita Brod Primary care provider: [facility]  Reason for consult:  seizures  Dear Dr Darleene Cleaver:  Thank you for your kind referral of Christina Nelson for consultation of the above symptoms. Although her history is well known to you, please allow me to reiterate it for the purpose of our medical record. She is alone in the office today. Records and images were personally reviewed where available.   HISTORY OF PRESENT ILLNESS: This is a pleasant 39 year old right-handed woman with a history of hypertension, substance abuse on Suboxone, depression, presenting for evaluation of seizures. She recalls the first seizure occurred 3-4 years ago on Christmas eve, she was under a lot of stress and recalls wrapping gifts then had a seizure. Another time she went to use the bathroom and was witnessed by her children to have a seizure. She recalls twitching, stuttering her words, then trying to grab the towel rack. She was brought to [facility] ER in April 2018 for seizure-like activity, she was walking down steps and fell, she reported 2 similar episodes over a 3 month period. Head CT no acute changes. She was discharged home on Levetiracetam 500mg  TID. She had been doing well with no events until January 2021. She had been sick for 3-4 days then on 11/07/19 while asleep on the couch, family heard a sound and found her having a GTC on the floor. Apparently she was laying face down beside the couch, she kept trying to push herself up but fell down, she crawled and tried to pull herself up on the space heater. She reported had 3 shaking episodes and was given IM Versed en route. She recalls waking up and being asked questions. She was brought to [facility], records reviewed. She was noted to have left gaze deviation that would not cross midline, with horizontal nystagmus, left arm with shoulder  flex/elbox ext posturing with increased tone, left leg with increased tone. Rectal temperature was elevated, WBC 24.3, UDS positive for cannabinoids and amphetamine. She was started on antibiotics. I personally reviewed imaging studies that patient brought today. She had a head CT which showed a focal area of white matter hypodensity with loss of gray white matter discrimination involving the left parietal convexity along the falx. MRI brain done 11/07/2019 showed a 50mm area of T2 hyperintensity in the high left parietal lobe posteriorly, edema primarily in the white matter. There was motion degradation, contrast was not administered. Post-contrast study did not show any definite abnormal enhancement, however subtle enhancement was difficult to exclude due to motion degradation.   Since hospital discharge, she feels back to baseline. She had one episode a week after where she got scared when she had tingling and numbness in the 4th and 5th digits of her left hand for 24 hours. She denies any staring/unresponsive episodes, olfactory/gustatory hallucinations, deja vu, rising epigastric sensation, focal weakness, myoclonic jerks. She has had headaches since childhood, diagnosed with migraines, with daily headaches. She has been taking Ibuprofen daily or every other day for the past month. There is photosensitivity, no nausea/vomiting, occasional nausea. She denies any dizziness, diplopia, dysarthria/dysphagia, neck pain, focal weakness, bladder dysfunction. She has some constipation. She has been on gabapentin 300mg  2 caps TID for the past 4 years for seizures and anxiety, and Seroquel qhs. She does not drive.   Epilepsy Risk Factors:  She had a normal birth and early  development.  There is no history of febrile convulsions, CNS infections such as meningitis/encephalitis, significant traumatic brain injury, neurosurgical procedures, or family history of seizures.   PAST MEDICAL HISTORY: Past Medical History:    Diagnosis Date  . Allergy   . Anxiety   . Back pain   . Depression   . Headache(784.0)   . Migraine   . Seizures ([place])   . Substance abuse ([place])     PAST SURGICAL HISTORY: Past Surgical History:  Procedure Laterality Date  . CESAREAN SECTION    . DILATION AND CURETTAGE OF UTERUS      MEDICATIONS: Current Outpatient Medications on File Prior to Visit  Medication Sig Dispense Refill  . amLODipine (NORVASC) 5 MG tablet Take 5 mg by mouth daily.    Marland Kitchen aspirin EC 81 MG tablet Take 81 mg by mouth daily.    . buprenorphine-naloxone (SUBOXONE) 8-2 mg SUBL SL tablet Place 1 tablet under the tongue every 12 (twelve) hours.    . gabapentin (NEURONTIN) 300 MG capsule Take 600-3,000 mg by mouth 3 (three) times daily with meals. 600mg  three times daily with meals and 10 capsules daily at bedtime     . ibuprofen (ADVIL,MOTRIN) 200 MG tablet Take 200 mg by mouth every 6 (six) hours as needed for mild pain or moderate pain.    Marland Kitchen levETIRAcetam (KEPPRA) 500 MG tablet Take 1 tablet (500 mg total) by mouth 2 (two) times daily. (Patient taking differently: Take 500 mg by mouth 3 (three) times daily. ) 60 tablet 0  . QUEtiapine (SEROQUEL XR) 50 MG TB24 24 hr tablet Take 50 mg by mouth every morning.     No current facility-administered medications on file prior to visit.    ALLERGIES: Allergies  Allergen Reactions  . Amoxicillin Hives    Breaks out next day  . Penicillins Hives    Has patient had a PCN reaction causing immediate rash, facial/tongue/throat swelling, SOB or lightheadedness with hypotension: NO Has patient had a PCN reaction causing severe rash involving mucus membranes or skin necrosis: No Has patient had a PCN reaction that required hospitalization No Has patient had a PCN reaction occurring within the last 10 years: No If all of the above answers are "NO", then may proceed with Cephalosporin use. ALL ARE NO     FAMILY HISTORY: Family History  Problem Relation Age of Onset   . Diabetes Mother   . Hypertension Mother   . Hyperlipidemia Mother   . Depression Mother   . Cancer Other   . Asthma Son   . Asthma Son   . Birth defects Son     SOCIAL HISTORY: Social History   Socioeconomic History  . Marital status: Single    Spouse name: Thurmond Butts  . Number of children: 2  . Years of education: 8  . Highest education level: Not on file  Occupational History  . Occupation: unemployed  Tobacco Use  . Smoking status: Current Every Day Smoker    Packs/day: 1.00    Years: 10.00    Pack years: 10.00    Types: Cigarettes  . Smokeless tobacco: Never Used  Substance and Sexual Activity  . Alcohol use: No  . Drug use: No    Comment: past history- pain pills, marijuana - last use May 2018  . Sexual activity: Yes    Birth control/protection: Condom  Other Topics Concern  . Not on file  Social History Narrative   Lives in apartment   Lives with 2  sons   Alycia Rossetti stays there most of the time   He is recovered addict   Spends leisure time with children, sleeps, watches TV   Social Determinants of Health   Financial Resource Strain:   . Difficulty of Paying Living Expenses: Not on file  Food Insecurity:   . Worried About Programme researcher, broadcasting/film/video in the Last Year: Not on file  . Ran Out of Food in the Last Year: Not on file  Transportation Needs:   . Lack of Transportation (Medical): Not on file  . Lack of Transportation (Non-Medical): Not on file  Physical Activity:   . Days of Exercise per Week: Not on file  . Minutes of Exercise per Session: Not on file  Stress:   . Feeling of Stress : Not on file  Social Connections:   . Frequency of Communication with Friends and Family: Not on file  . Frequency of Social Gatherings with Friends and Family: Not on file  . Attends Religious Services: Not on file  . Active Member of Clubs or Organizations: Not on file  . Attends Banker Meetings: Not on file  . Marital Status: Not on file  Intimate Partner  Violence:   . Fear of Current or Ex-Partner: Not on file  . Emotionally Abused: Not on file  . Physically Abused: Not on file  . Sexually Abused: Not on file    REVIEW OF SYSTEMS: Constitutional: No fevers, chills, or sweats, no generalized fatigue, change in appetite Eyes: No visual changes, double vision, eye pain Ear, nose and throat: No hearing loss, ear pain, nasal congestion, sore throat Cardiovascular: No chest pain, palpitations Respiratory:  No shortness of breath at rest or with exertion, wheezes GastrointestinaI: No nausea, vomiting, diarrhea, abdominal pain, fecal incontinence Genitourinary:  No dysuria, urinary retention or frequency Musculoskeletal:  No neck pain,+ back pain Integumentary: No rash, pruritus, skin lesions Neurological: as above Psychiatric: No depression, insomnia, anxiety Endocrine: No palpitations, fatigue, diaphoresis, mood swings, change in appetite, change in weight, increased thirst Hematologic/Lymphatic:  No anemia, purpura, petechiae. Allergic/Immunologic: no itchy/runny eyes, nasal congestion, recent allergic reactions, rashes  PHYSICAL EXAM: Vitals:   12/10/19 1313  BP: 117/82  Pulse: 86  SpO2: 98%   General: No acute distress Head:  Normocephalic/atraumatic Skin/Extremities: No rash, no edema Neurological Exam: Mental status: alert and oriented to person, place, and time, no dysarthria or aphasia, Fund of knowledge is appropriate.  Recent and remote memory are intact.  Attention and concentration are normal.    Able to name objects and repeat phrases. Cranial nerves: CN I: not tested CN II: pupils equal, round and reactive to light, visual fields intact CN III, IV, VI:  full range of motion, no nystagmus, no ptosis CN V: facial sensation intact CN VII: upper and lower face symmetric CN VIII: hearing intact to conversation Motor: 5/5 throughout with no pronator drift. Sensation: intact to light touch, cold, pin, vibration and joint  position sense.  No extinction to double simultaneous stimulation.  Romberg test negative Deep Tendon Reflexes: +2 throughout, no ankle clonus Plantar responses: downgoing bilaterally Cerebellar: no incoordination on finger to nose testing Gait: narrow-based and steady, able to tandem walk adequately. Tremor: none  IMPRESSION: This is a pleasant 39 year old right-handed woman with a history of hypertension, substance abuse on Suboxone, depression, presenting for evaluation of seizures. Seizures started 3 years ago, she had been seizure-free on Levetiracetam 500mg  TID until she had a systemic illness last January 2021 and  had several GTCs with report of left gaze deviation and left-sided weakness. MRI brain had shown 49mm area of T2 hyperintensity in the high left parietal lobe posteriorly, edema primarily in the white matter. She has been back to baseline with no further seizures in the past month, exam today normal. Etiology of left parietal lesion unclear, repeat MRI brain with and without contrast will be ordered for interval follow-up. A 1-hour EEG will be done to further classify seizures. Continue Levetiracetam 500mg  TID, refills sent. She does not drive. Follow-up in 3 months, she knows to call for any changes.   Thank you for allowing me to participate in the care of this patient. Please do not hesitate to call for any questions or concerns.   , M.D.  CC: Dr. Patrcia Dolly, [facility]

## 2019-12-10 NOTE — Patient Instructions (Signed)
1. Schedule MRI brain with and without contrast  2. Schedule 1-hour EEG  3. Continue Keppra 500mg  three times a day  4. Follow-up in 3 months, call for any changes.  Seizure Precautions: 1. If medication has been prescribed for you to prevent seizures, take it exactly as directed.  Do not stop taking the medicine without talking to your doctor first, even if you have not had a seizure in a long time.   2. Avoid activities in which a seizure would cause danger to yourself or to others.  Don't operate dangerous machinery, swim alone, or climb in high or dangerous places, such as on ladders, roofs, or girders.  Do not drive unless your doctor says you may.  3. If you have any warning that you may have a seizure, lay down in a safe place where you can't hurt yourself.    4.  No driving for 6 months from last seizure, as per [facility].   Please refer to the following link on the Epilepsy Foundation of America's website for more information: http://www.epilepsyfoundation.org/answerplace/Social/driving/drivingu.cfm   5.  Maintain good sleep hygiene. Avoid alcohol.  6.  Notify your neurology if you are planning pregnancy or if you become pregnant.  7.  Contact your doctor if you have any problems that may be related to the medicine you are taking.  8.  Call 911 and bring the patient back to the ED if:        A.  The seizure lasts longer than 5 minutes.       B.  The patient doesn't awaken shortly after the seizure  C.  The patient has new problems such as difficulty seeing, speaking or moving  D.  The patient was injured during the seizure  E.  The patient has a temperature over 102 F (39C)  F.  The patient vomited and now is having trouble breathing

## 2019-12-11 ENCOUNTER — Telehealth: Payer: Self-pay

## 2019-12-11 NOTE — Telephone Encounter (Signed)
Pt called no answer voice mail left MRI scheduled for Feb 19th at [facility] at 5 pm has to be there at 4:30pm

## 2019-12-12 ENCOUNTER — Other Ambulatory Visit: Payer: Medicaid Other

## 2019-12-18 ENCOUNTER — Other Ambulatory Visit: Payer: Medicaid Other

## 2019-12-21 ENCOUNTER — Ambulatory Visit (HOSPITAL_COMMUNITY): Admission: RE | Admit: 2019-12-21 | Payer: Medicaid Other | Source: Ambulatory Visit

## 2020-01-14 ENCOUNTER — Other Ambulatory Visit: Payer: Medicaid Other

## 2020-01-29 ENCOUNTER — Ambulatory Visit (HOSPITAL_COMMUNITY): Admission: RE | Admit: 2020-01-29 | Payer: Medicaid Other | Source: Ambulatory Visit

## 2020-03-11 ENCOUNTER — Ambulatory Visit: Payer: Medicaid Other | Admitting: Neurology

## 2020-05-16 ENCOUNTER — Emergency Department (INDEPENDENT_AMBULATORY_CARE_PROVIDER_SITE_OTHER)
Admission: EM | Admit: 2020-05-16 | Discharge: 2020-05-16 | Disposition: A | Discharge status: Home / Self Care | Payer: Medicaid Other | Source: Home / Self Care

## 2020-05-16 ENCOUNTER — Other Ambulatory Visit: Payer: Self-pay

## 2020-05-16 DIAGNOSIS — R220 Localized swelling, mass and lump, head: Secondary | ICD-10-CM

## 2020-05-16 DIAGNOSIS — K0889 Other specified disorders of teeth and supporting structures: Secondary | ICD-10-CM

## 2020-05-16 MED ORDER — CLINDAMYCIN HCL 300 MG PO CAPS: 300.0000 mg | ORAL_CAPSULE | Freq: Three times a day (TID) | ORAL | 0 refills | Status: AC

## 2020-05-16 MED ORDER — PREDNISONE 50 MG PO TABS: 50.0000 mg | ORAL_TABLET | Freq: Every day | ORAL | 0 refills | Status: AC

## 2020-05-16 NOTE — ED Provider Notes (Signed)
Christina Nelson [facility]    CSN: [id number] Arrival date & time: 05/16/20  1305      History   Chief Complaint Chief Complaint  Patient presents with   Dental Pain    HPI Christina Nelson is a 39 y.o. female.   HPI Dental pain and left lower facial pain with swelling. Onset x 1 day ago. History of chronically poor dentition problems and endorses that she is overdue for dentist visit. She denies fever, chills, nausea, and vomiting. She endorses upper and lower gum pain. She has taken Ibuprofen as needed for pain.  Past Medical History:  Diagnosis Date   Allergy    Anxiety    Back pain    Depression    Headache(784.0)    Migraine    Seizures (HCC)    Substance abuse [facility])     Patient Active Problem List   Diagnosis Date Noted   Substance abuse in remission (HCC) 05/31/2017   Tobacco abuse 05/31/2017   Seizure-like activity (HCC) 05/31/2017   Anxiety with depression 05/31/2017    Past Surgical History:  Procedure Laterality Date   CESAREAN SECTION     DILATION AND CURETTAGE OF UTERUS      OB History    Gravida  4   Para  2   Term      Preterm  2   AB  2   Living  2     SAB  2   TAB      Ectopic      Multiple      Live Births               Home Medications    Prior to Admission medications   Medication Sig Start Date End Date Taking? Authorizing Provider  cloNIDine (CATAPRES) 0.2 MG tablet Take 0.2 mg by mouth 2 (two) times daily.   Yes [provider]  amLODipine (NORVASC) 5 MG tablet Take 5 mg by mouth daily.    [provider]  aspirin EC 81 MG tablet Take 81 mg by mouth daily.    [provider]  buprenorphine-naloxone (SUBOXONE) 8-2 mg SUBL SL tablet Place 1 tablet under the tongue every 12 (twelve) hours.    [provider]  gabapentin (NEURONTIN) 300 MG capsule Take 600-3,000 mg by mouth 3 (three) times daily with meals. 600mg  three times daily with meals and 10 capsules daily at  bedtime     [provider]  ibuprofen (ADVIL,MOTRIN) 200 MG tablet Take 200 mg by mouth every 6 (six) hours as needed for mild pain or moderate pain.    [provider]  levETIRAcetam (KEPPRA) 500 MG tablet Take 1 tablet three times a day 12/10/19   02/07/20, MD  QUEtiapine (SEROQUEL XR) 50 MG TB24 24 hr tablet Take 50 mg by mouth every morning.    [provider]    Family History Family History  Problem Relation Age of Onset   Diabetes Mother    Hypertension Mother    Hyperlipidemia Mother    Depression Mother    Cancer Other    Asthma Son    Asthma Son    Birth defects Son     Social History Social History   Tobacco Use   Smoking status: Current Every Day Smoker    Packs/day: 1.00    Years: 10.00    Pack years: 10.00    Types: Cigarettes   Smokeless tobacco: Never Used  Van Clines  Vaping Use: Some days  Substance Use Topics   Alcohol use: No   Drug use: No    Comment: past history- pain pills, marijuana - last use May 2018     Allergies   Amoxicillin and Penicillins  Review of Systems Review of Systems Pertinent negatives listed in HPI  Physical Exam Triage Vital Signs ED Triage Vitals  Enc Vitals Group     BP 05/16/20 1331 117/83     Pulse Rate 05/16/20 1331 72     Resp 05/16/20 1331 16     Temp 05/16/20 1331 98.1 F (36.7 C)     Temp Source 05/16/20 1331 Oral     SpO2 05/16/20 1331 99 %     Weight --      Height --      Head Circumference --      Peak Flow --      Pain Score 05/16/20 1327 9     Pain Loc --      Pain Edu? --      Excl. in GC? --    No data found.  Updated Vital Signs BP 117/83 (BP Location: Right Arm)    Pulse 72    Temp 98.1 F (36.7 C) (Oral)    Resp 16    SpO2 99%   Visual Acuity Right Eye Distance:   Left Eye Distance:   Bilateral Distance:    Right Eye Near:   Left Eye Near:    Bilateral Near:     Physical Exam Constitutional:      Appearance: She is  ill-appearing.  HENT:     Head: Normocephalic.      Mouth/Throat:     Dentition: Abnormal dentition. Dental tenderness, gingival swelling and dental caries present.  Neurological:     Mental Status: She is alert.  Psychiatric:        Behavior: Behavior is cooperative.      UC Treatments / Results  Labs (all labs ordered are listed, but only abnormal results are displayed) Labs Reviewed - No data to display  EKG   Radiology No results found.  Procedures Procedures (including critical care time)  Medications Ordered in UC Medications - No data to display  Initial Impression / Assessment and Plan / UC Course  I have reviewed the triage vital signs and the nursing notes.  Pertinent labs & imaging results that were available during my care of the patient were reviewed by me and considered in my medical decision making (see chart for details).    Clindamycin 300 mg TID x 10 days. Prednisone 50 mg daily x 5 days. Information provided to follow-up with a dentist. Red flags discussed.  An After Visit Summary was printed and given to the patient/family. Precautions discussed. Red flags discussed. Questions invited and answered. They voiced understanding and agreement. Final Clinical Impressions(s) / UC Diagnoses   Final diagnoses:  Pain, dental  Left facial swelling     Discharge Instructions     [facility], [place]  Walk in accepted,  [phone]  Emergency Department if symptoms worsens or facial swelling increases.    ED Prescriptions    Medication Sig Dispense Auth. Provider   clindamycin (CLEOCIN) 300 MG capsule Take 1 capsule (300 mg total) by mouth 3 (three) times daily for 10 days. 30 capsule Bing Neighbors, FNP   predniSONE (DELTASONE) 50 MG tablet Take 1 tablet (50 mg total) by mouth daily with breakfast for 5 days. 5 tablet  Bing Neighbors, FNP     PDMP not reviewed this encounter.   Bing Neighbors,  FNP 05/20/20 [phone]

## 2020-05-16 NOTE — ED Triage Notes (Signed)
Patient presents to Urgent Care with complaints of left lower dental pain since yesterday. Patient reports it feels tight and sore, unable to eat much more than yogurt.

## 2020-05-16 NOTE — Discharge Instructions (Addendum)
[facility], [place]  Walk in accepted,  [phone]  Emergency Department if symptoms worsens or facial swelling increases.

## 2021-12-24 ENCOUNTER — Telehealth: Payer: Medicaid Other | Admitting: Nurse Practitioner

## 2021-12-24 DIAGNOSIS — Z760 Encounter for issue of repeat prescription: Secondary | ICD-10-CM

## 2021-12-24 DIAGNOSIS — F5101 Primary insomnia: Secondary | ICD-10-CM

## 2021-12-24 MED ORDER — QUETIAPINE FUMARATE 100 MG PO TABS: 300.0000 mg | ORAL_TABLET | Freq: Every day | ORAL | 0 refills | Status: DC

## 2021-12-24 NOTE — Progress Notes (Signed)
Virtual Visit Consent   [name], you are scheduled for a virtual visit with [name], FNP, a [facility] provider, today.     Just as with appointments in the office, your consent must be obtained to participate.  Your consent will be active for this visit and any virtual visit you may have with one of our providers in the next 365 days.     If you have a MyChart account, a copy of this consent can be sent to you electronically.  All virtual visits are billed to your insurance company just like a traditional visit in the office.    As this is a virtual visit, video technology does not allow for your provider to perform a traditional examination.  This may limit your provider's ability to fully assess your condition.  If your provider identifies any concerns that need to be evaluated in person or the need to arrange testing (such as labs, EKG, etc.), we will make arrangements to do so.     Although advances in technology are sophisticated, we cannot ensure that it will always work on either your end or our end.  If the connection with a video visit is poor, the visit may have to be switched to a telephone visit.  With either a video or telephone visit, we are not always able to ensure that we have a secure connection.     I need to obtain your verbal consent now.   Are you willing to proceed with your visit today? YES   [name] has provided verbal consent on 12/24/2021 for a virtual visit (video or telephone).   [name], FNP   Date: 12/24/2021 11:39 AM   Virtual Visit via Video Note   I, [name], connected with EARLINE STINER ([id number], 02/09/1981) on 12/24/21 at 11:45 AM EST by a video-enabled telemedicine application and verified that I am speaking with the correct person using two identifiers.  Location: Patient: Virtual Visit Location Patient: Home Provider: Virtual Visit Location Provider: Mobile   I discussed the limitations of  evaluation and management by telemedicine and the availability of in person appointments. The patient expressed understanding and agreed to proceed.    History of Present Illness: Christina Nelson is a 41 y.o. who identifies as a female who was assigned female at birth, and is being seen today for medication .  HPI: Patient says that her PCP retired and she cannot get in to see new PCP appointment until January 11, 2022. Needs mediction refills. Needs her seroquel refiled. Dosage on chart says 50g but patient says she is on 100mg  3 at bedtime. Patient showed me prescription bottle with her name on it and 100mg  3 at bedtime.   Review of Systems  Constitutional:  Negative for diaphoresis and weight loss.  Eyes:  Negative for blurred vision, double vision and pain.  Respiratory:  Negative for shortness of breath.   Cardiovascular:  Negative for chest pain, palpitations, orthopnea and leg swelling.  Gastrointestinal:  Negative for abdominal pain.  Skin:  Negative for rash.  Neurological:  Negative for dizziness, sensory change, loss of consciousness, weakness and headaches.  Endo/Heme/Allergies:  Negative for polydipsia. Does not bruise/bleed easily.  Psychiatric/Behavioral:  Negative for memory loss. The patient does not have insomnia.   All other systems reviewed and are negative.  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   Tobacco abuse  05/31/2017   Seizure-like activity (HCC) 05/31/2017   Anxiety with depression 05/31/2017    Allergies:  Allergies  Allergen Reactions   Amoxicillin Hives    Breaks out next day   Penicillins Hives    Has patient had a PCN reaction causing immediate rash, facial/tongue/throat swelling, SOB or lightheadedness with hypotension: NO Has patient had a PCN reaction causing severe rash involving mucus membranes or skin necrosis: No Has patient had a PCN reaction that required hospitalization  No Has patient had a PCN reaction occurring within the last 10 years: No If all of the above answers are "NO", then may proceed with Cephalosporin use. ALL ARE NO    Medications:  Current Outpatient Medications:    amLODipine (NORVASC) 5 MG tablet, Take 5 mg by mouth daily., Disp: , Rfl:    aspirin EC 81 MG tablet, Take 81 mg by mouth daily., Disp: , Rfl:    buprenorphine-naloxone (SUBOXONE) 8-2 mg SUBL SL tablet, Place 1 tablet under the tongue every 12 (twelve) hours., Disp: , Rfl:    cloNIDine (CATAPRES) 0.2 MG tablet, Take 0.2 mg by mouth 2 (two) times daily., Disp: , Rfl:    gabapentin (NEURONTIN) 300 MG capsule, Take 600-3,000 mg by mouth 3 (three) times daily with meals. 600mg  three times daily with meals and 10 capsules daily at bedtime , Disp: , Rfl:    ibuprofen (ADVIL,MOTRIN) 200 MG tablet, Take 200 mg by mouth every 6 (six) hours as needed for mild pain or moderate pain., Disp: , Rfl:    levETIRAcetam (KEPPRA) 500 MG tablet, Take 1 tablet three times a day, Disp: 90 tablet, Rfl: 11   QUEtiapine (SEROQUEL XR) 50 MG TB24 24 hr tablet, Take 50 mg by mouth every morning., Disp: , Rfl:   Observations/Objective: Patient is well-developed, well-nourished in no acute distress.  Resting comfortably  at home.  Head is normocephalic, atraumatic.  No labored breathing.  Speech is clear and coherent with logical content.  Patient is alert and oriented at baseline.    Assessment and Plan:  in today with chief complaint of Medication Refill   1. Medication refill 2. Primary insomnia Bedtime routine Make sure keep appointment with new PCP  Meds ordered this encounter  Medications   QUEtiapine (SEROQUEL) 100 MG tablet    Sig: Take 3 tablets (300 mg total) by mouth at bedtime.    Dispense:  60 tablet    Refill:  0    Order Specific Question:   Supervising Provider    Answer:   [name] [[id number]]        Follow Up Instructions: I discussed the assessment and  treatment plan with the patient. The patient was provided an opportunity to ask questions and all were answered. The patient agreed with the plan and demonstrated an understanding of the instructions.  A copy of instructions were sent to the patient via MyChart.  The patient was advised to call back or seek an in-person evaluation if the symptoms worsen or if the condition fails to improve as anticipated.  Time:  I spent 10 minutes with the patient via telehealth technology discussing the above problems/concerns.    Mary-Margaret Eber Hong, FNP

## 2022-01-09 DIAGNOSIS — K047 Periapical abscess without sinus: Secondary | ICD-10-CM | POA: Insufficient documentation

## 2022-01-09 DIAGNOSIS — F17200 Nicotine dependence, unspecified, uncomplicated: Secondary | ICD-10-CM | POA: Insufficient documentation

## 2022-01-30 ENCOUNTER — Telehealth: Payer: Medicaid Other | Admitting: Family Medicine

## 2022-01-30 DIAGNOSIS — K047 Periapical abscess without sinus: Secondary | ICD-10-CM | POA: Diagnosis not present

## 2022-01-30 MED ORDER — CLINDAMYCIN HCL 300 MG PO CAPS: 300.0000 mg | ORAL_CAPSULE | Freq: Three times a day (TID) | ORAL | 0 refills | Status: AC

## 2022-01-30 NOTE — Patient Instructions (Signed)
Dental Abscess ?A dental abscess is an infection around a tooth that may involve pain, swelling, and a collection of pus, as well as other symptoms. Treatment is important to help with symptoms and to prevent the infection from spreading. ?The general types of dental abscesses are: ?Pulpal abscess. This abscess may form from the inner part of the tooth (pulp). ?Periodontal abscess. This abscess may form from the gum. ?What are the causes? ?This condition is caused by a bacterial infection in or around the tooth. It may result from: ?Severe tooth decay (cavities). ?Trauma to the tooth, such as a broken or chipped tooth. ?What increases the risk? ?This condition is more likely to develop in males. It is also more likely to develop in people who: ?Have cavities. ?Have severe gum disease. ?Eat sugary snacks between meals. ?Use tobacco products. ?Have diabetes. ?Have a weakened disease-fighting system (immune system). ?Do not brush and care for their teeth regularly. ?What are the signs or symptoms? ?Mild symptoms of this condition include: ?Tenderness. ?Bad breath. ?Fever. ?A bitter taste in the mouth. ?Pain in and around the infected tooth. ?Moderate symptoms of this condition include: ?Swollen neck glands. ?Chills. ?Pus drainage. ?Swelling and redness around the infected tooth, in the mouth, or in the face. ?Severe pain in and around the infected tooth. ?Severe symptoms of this condition include: ?Difficulty swallowing. ?Difficulty opening the mouth. ?Nausea. ?Vomiting. ?How is this diagnosed? ?This condition is diagnosed based on: ?Your symptoms and your medical and dental history. ?An examination of the infected tooth. During the exam, your dental care provider may tap on the infected tooth. ?You may also need to have X-rays taken of the affected area. ?How is this treated? ?This condition is treated by getting rid of the infection. This may be done with: ?Antibiotic medicines. These may be used in certain  situations. ?Antibacterial mouth rinse. ?Incision and drainage. This procedure is done by making an incision in the abscess to drain out the pus. Removing pus is the first priority in treating an abscess. ?A root canal. This may be performed to save the tooth. Your dental care provider accesses the visible part of your tooth (crown) with a drill and removes any infected pulp. Then the space is filled and sealed off. ?Tooth extraction. The tooth is pulled out if it cannot be saved by other treatment. ?You may also receive treatment for pain, such as: ?Acetaminophen or NSAIDs. ?Gels that contain a numbing medicine. ?An injection to block the pain near your nerve. ?Follow these instructions at home: ?Medicines ?Take over-the-counter and prescription medicines only as told by your dental care provider. ?If you were prescribed an antibiotic, take it as told by your dental care provider. Do not stop taking the antibiotic even if you start to feel better. ?If you were prescribed a gel that contains a numbing medicine, use it exactly as told in the directions. Do not use these gels for children who are younger than 2 years of age. ?Use an antibacterial mouth rinse as told by your dental care provider. ?General instructions ? ?Gargle with a mixture of salt and water 3-4 times a day or as needed. To make salt water, completely dissolve ?-1 tsp (3-6 g) of salt in 1 cup (237 mL) of warm water. ?Eat a soft diet while your abscess is healing. ?Drink enough fluid to keep your urine pale yellow. ?Do not apply heat to the outside of your mouth. ?Do not use any products that contain nicotine or tobacco. These products   include cigarettes, chewing tobacco, and vaping devices, such as e-cigarettes. If you need help quitting, ask your dental care provider. ?Keep all follow-up visits. This is important. ?How is this prevented? ? ?Excellent dental home care, which includes brushing your teeth every morning and night with fluoride  toothpaste. Floss one time each day. ?Get regularly scheduled dental cleanings. ?Consider having a dental sealant applied on teeth that have deep grooves to prevent cavities. ?Drink fluoridated water regularly. This includes most tap water. Check the label on bottled water to see if it contains fluoride. ?Reduce or eliminate sugary drinks. ?Eat healthy meals and snacks. ?Wear a mouth guard or face shield to protect your teeth while playing sports. ?Contact a health care provider if: ?Your pain is worse and is not helped by medicine. ?You have swelling. ?You see pus around the tooth. ?You have a fever or chills. ?Get help right away if: ?Your symptoms suddenly get worse. ?You have a very bad headache. ?You have problems breathing or swallowing. ?You have trouble opening your mouth. ?You have swelling in your neck or around your eye. ?These symptoms may represent a serious problem that is an emergency. Do not wait to see if the symptoms will go away. Get medical help right away. Call your local emergency services (911 in the [place] Do not drive yourself to the hospital. ?Summary ?A dental abscess is a collection of pus in or around a tooth that results from an infection. ?A dental abscess may result from severe tooth decay, trauma to the tooth, or severe gum disease around a tooth. ?Symptoms include severe pain, swelling, redness, and drainage of pus in and around the infected tooth. ?The first priority in treating a dental abscess is to drain out the pus. Treatment may also involve removing damage inside the tooth (root canal) or extracting the tooth. ?This information is not intended to replace advice given to you by your health care provider. Make sure you discuss any questions you have with your health care provider. ?Document Revised: 12/25/2020 Document Reviewed: 12/25/2020 ?Elsevier Patient Education ? 2022 Else[employer] ? ?

## 2022-01-30 NOTE — Progress Notes (Signed)
?Virtual Visit Consent  ? ?[name], you are scheduled for a virtual visit with a [facility] provider today.   ?  ?Just as with appointments in the office, your consent must be obtained to participate.  Your consent will be active for this visit and any virtual visit you may have with one of our providers in the next 365 days.   ?  ?If you have a MyChart account, a copy of this consent can be sent to you electronically.  All virtual visits are billed to your insurance company just like a traditional visit in the office.   ? ?As this is a virtual visit, video technology does not allow for your provider to perform a traditional examination.  This may limit your provider's ability to fully assess your condition.  If your provider identifies any concerns that need to be evaluated in person or the need to arrange testing (such as labs, EKG, etc.), we will make arrangements to do so.   ?  ?Although advances in technology are sophisticated, we cannot ensure that it will always work on either your end or our end.  If the connection with a video visit is poor, the visit may have to be switched to a telephone visit.  With either a video or telephone visit, we are not always able to ensure that we have a secure connection.    ? ?I need to obtain your verbal consent now.   Are you willing to proceed with your visit today?  ?  ?Roniesha EPPIE BARHORST has provided verbal consent on 01/30/2022 for a virtual visit (video or telephone). ?  ?Georgana Curio, FNP  ? ?Date: 01/30/2022 3:13 PM ? ? ?Virtual Visit via Video Note  ? ?I, Georgana Curio, connected with  Christina Nelson  ([id number], 1981-07-03) on 01/30/22 at  3:15 PM EDT by a video-enabled telemedicine application and verified that I am speaking with the correct person using two identifiers. ? ?Location: ?Patient: Virtual Visit Location Patient: Home ?Provider: Virtual Visit Location Provider: Home Office ?  ?I discussed the limitations of evaluation and management by telemedicine and  the availability of in person appointments. The patient expressed understanding and agreed to proceed.   ? ?History of Present Illness: ?Christina Nelson is a 41 y.o. who identifies as a female who was assigned female at birth, and is being seen today for left lower dental abscess. She woke this am with face swollen. She has had this before she reports and took clindamycin due to PCN allergy. No fever.  ? ?HPI: HPI  ?Problems:  ?Patient Active Problem List  ? Diagnosis Date Noted  ? 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 Tobacco abuse 05/31/2017  ? Seizure-like activity (HCC) 05/31/2017  ? Anxiety with depression 05/31/2017  ?  ?Allergies:  ?Allergies  ?Allergen Reactions  ? Amoxicillin Hives  ?  Breaks out next day  ? Penicillins Hives  ?  Has patient had a PCN reaction causing immediate rash, facial/tongue/throat swelling, SOB or lightheadedness with hypotension: NO ?Has patient had a PCN reaction causing severe rash involving mucus membranes or skin necrosis: No ?Has patient had a PCN reaction that required hospitalization No ?Has patient had a PCN reaction occurring within the last 10 years: No ?If all of the above answers are "NO", then may proceed with Cephalosporin use. ALL ARE NO ?  ? ?Medications:  ?Current Outpatient Medications:  ?  amLODipine (NORVASC) 5 MG tablet, Take 5 mg by mouth daily.,  Disp: , Rfl:  ?  aspirin EC 81 MG tablet, Take 81 mg by mouth daily., Disp: , Rfl:  ?  buprenorphine-naloxone (SUBOXONE) 8-2 mg SUBL SL tablet, Place 1 tablet under the tongue every 12 (twelve) hours., Disp: , Rfl:  ?  cloNIDine (CATAPRES) 0.2 MG tablet, Take 0.2 mg by mouth 2 (two) times daily., Disp: , Rfl:  ?  gabapentin (NEURONTIN) 300 MG capsule, Take 600-3,000 mg by mouth 3 (three) times daily with meals. 600mg  three times daily with meals and 10 capsules daily at bedtime , Disp: , Rfl:  ?  ibuprofen (ADVIL,MOTRIN) 200 MG tablet, Take 200 mg by mouth every 6 (six) hours as needed for mild pain or  moderate pain., Disp: , Rfl:  ?  levETIRAcetam (KEPPRA) 500 MG tablet, Take 1 tablet three times a day, Disp: 90 tablet, Rfl: 11 ?  QUEtiapine (SEROQUEL) 100 MG tablet, Take 3 tablets (300 mg total) by mouth at bedtime., Disp: 60 tablet, Rfl: 0 ? ?Observations/Objective: ?Patient is well-developed, well-nourished in no acute distress.  ?Mild swelling noted in left side of face.  ?Resting comfortably  at home.  ?Head is normocephalic, atraumatic.  ?No labored breathing.  ?Speech is clear and coherent with logical content.  ?Patient is alert and oriented at baseline.  ? ? ?Assessment and Plan: ?1. Abscessed tooth ? ?Warm salt water rinses, ibuprofen as directed, med use and side effects discussed, follow up with dentist this week.  ? ?Follow Up Instructions: ?I discussed the assessment and treatment plan with the patient. The patient was provided an opportunity to ask questions and all were answered. The patient agreed with the plan and demonstrated an understanding of the instructions.  A copy of instructions were sent to the patient via MyChart unless otherwise noted below.  ? ? ? ?The patient was advised to call back or seek an in-person evaluation if the symptoms worsen or if the condition fails to improve as anticipated. ? ?Time:  ?I spent 8 minutes with the patient via telehealth technology discussing the above problems/concerns.   ? ? , FNP ?

## 2022-02-14 ENCOUNTER — Telehealth: Payer: Medicaid Other | Admitting: Physician Assistant

## 2022-02-14 NOTE — Progress Notes (Signed)
The patient no-showed for appointment despite this provider sending direct link with no response and waiting for at least 10 minutes from appointment time for patient to join. They will be marked as a NS for this appointment/time.   Mattie Nordell M Laurelyn Terrero, PA-C    

## 2022-02-15 ENCOUNTER — Telehealth: Payer: Medicaid Other

## 2022-04-04 ENCOUNTER — Telehealth: Payer: Medicaid Other | Admitting: Nurse Practitioner

## 2022-04-04 DIAGNOSIS — K047 Periapical abscess without sinus: Secondary | ICD-10-CM

## 2022-04-04 MED ORDER — CLINDAMYCIN HCL 300 MG PO CAPS: 300.0000 mg | ORAL_CAPSULE | Freq: Three times a day (TID) | ORAL | 0 refills | Status: AC

## 2022-04-04 MED ORDER — CHLORHEXIDINE GLUCONATE 0.12 % MT SOLN: 15.0000 mL | Freq: Two times a day (BID) | OROMUCOSAL | 0 refills | Status: DC

## 2022-04-04 NOTE — Patient Instructions (Signed)
  Christina Nelson, thank you for joining Claiborne Rigg, NP for today's virtual visit.  While this provider is not your primary care provider (PCP), if your PCP is located in our provider database this encounter information will be shared with them immediately following your visit.  Consent: (Patient) Christina Nelson provided verbal consent for this virtual visit at the beginning of the encounter.  Current Medications:  Current Outpatient Medications:    chlorhexidine (PERIDEX) 0.12 % solution, Use as directed 15 mLs in the mouth or throat 2 (two) times daily., Disp: 473 mL, Rfl: 0   clindamycin (CLEOCIN) 300 MG capsule, Take 1 capsule (300 mg total) by mouth 3 (three) times daily for 10 days., Disp: 30 capsule, Rfl: 0   amLODipine (NORVASC) 5 MG tablet, Take 5 mg by mouth daily., Disp: , Rfl:    aspirin EC 81 MG tablet, Take 81 mg by mouth daily., Disp: , Rfl:    buprenorphine-naloxone (SUBOXONE) 8-2 mg SUBL SL tablet, Place 1 tablet under the tongue every 12 (twelve) hours., Disp: , Rfl:    gabapentin (NEURONTIN) 300 MG capsule, Take 600-3,000 mg by mouth 3 (three) times daily with meals. 600mg  three times daily with meals and 10 capsules daily at bedtime , Disp: , Rfl:    ibuprofen (ADVIL,MOTRIN) 200 MG tablet, Take 200 mg by mouth every 6 (six) hours as needed for mild pain or moderate pain., Disp: , Rfl:    levETIRAcetam (KEPPRA) 500 MG tablet, Take 1 tablet three times a day, Disp: 90 tablet, Rfl: 11   QUEtiapine (SEROQUEL) 100 MG tablet, Take 3 tablets (300 mg total) by mouth at bedtime., Disp: 60 tablet, Rfl: 0   Medications ordered in this encounter:  Meds ordered this encounter  Medications   clindamycin (CLEOCIN) 300 MG capsule    Sig: Take 1 capsule (300 mg total) by mouth 3 (three) times daily for 10 days.    Dispense:  30 capsule    Refill:  0    Order Specific Question:   Supervising Provider    Answer:   MILLER, BRIAN [[id number]]   chlorhexidine (PERIDEX) 0.12 % solution    Sig:  Use as directed 15 mLs in the mouth or throat 2 (two) times daily.    Dispense:  473 mL    Refill:  0    Order Specific Question:   Supervising Provider    Answer:   , BRIAN [[id number]]     *If you need refills on other medications prior to your next appointment, please contact your pharmacy*  Follow-Up: Call back or seek an in-person evaluation if the symptoms worsen or if the condition fails to improve as anticipated.  Other Instructions NEEDS TO SEE A DENTIST AS SOON AS POSSIBLE   If you have been instructed to have an in-person evaluation today at a local Urgent Care facility, please use the link below. It will take you to a list of all of our available [facility], including address, phone number and hours of operation. Please do not delay care.  [facility]  If you or a family member do not have a primary care provider, use the link below to schedule a visit and establish care. When you choose a [facility] primary care physician or advanced practice provider, you gain a long-term partner in health. Find a Primary Care Provider  Learn more about [facility] in-office and virtual care options:  - Get Care Now

## 2022-04-04 NOTE — Progress Notes (Signed)
Virtual Visit Consent   Christina Nelson, you are scheduled for a virtual visit with a [facility] provider today. Just as with appointments in the office, your consent must be obtained to participate. Your consent will be active for this visit and any virtual visit you may have with one of our providers in the next 365 days. If you have a MyChart account, a copy of this consent can be sent to you electronically.  As this is a virtual visit, video technology does not allow for your provider to perform a traditional examination. This may limit your provider's ability to fully assess your condition. If your provider identifies any concerns that need to be evaluated in person or the need to arrange testing (such as labs, EKG, etc.), we will make arrangements to do so. Although advances in technology are sophisticated, we cannot ensure that it will always work on either your end or our end. If the connection with a video visit is poor, the visit may have to be switched to a telephone visit. With either a video or telephone visit, we are not always able to ensure that we have a secure connection.  By engaging in this virtual visit, you consent to the provision of healthcare and authorize for your insurance to be billed (if applicable) for the services provided during this visit. Depending on your insurance coverage, you may receive a charge related to this service.  I need to obtain your verbal consent now. Are you willing to proceed with your visit today? Christina Nelson has provided verbal consent on 04/04/2022 for a virtual visit (video or telephone). Claiborne Rigg, NP  Date: 04/04/2022 12:05 PM  Virtual Visit via Video Note   I, Claiborne Rigg, connected with  Christina Nelson  ([id number], Aug 12, 1981) on 04/04/22 at 12:45 PM EDT by a video-enabled telemedicine application and verified that I am speaking with the correct person using two identifiers.  Location: Patient: Virtual Visit Location Patient:  Home Provider: Virtual Visit Location Provider: Home Office   I discussed the limitations of evaluation and management by telemedicine and the availability of in person appointments. The patient expressed understanding and agreed to proceed.    History of Present Illness: Christina Nelson is a 41 y.o. who identifies as a female who was assigned female at birth, and is being seen today for dental infection.  Patient has 4 telemedicine/video visits since March for dental infection. I have strongly encouraged her to see evaluation from a dentist as continuing to take antibiotics long term are not effective and can cause antibiotic resistance. She endorses numerous teeth that are broken along with pain in the gumline when she attempts to eat and right sided facial swelling with periorbital involvement.  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   Tobacco abuse 05/31/2017   Seizure-like activity (HCC) 05/31/2017   Anxiety with depression 05/31/2017    Allergies:  Allergies  Allergen Reactions   Amoxicillin Hives    Breaks out next day   Penicillins Hives    Has patient had a PCN reaction causing immediate rash, facial/tongue/throat swelling, SOB or lightheadedness with hypotension: NO Has patient had a PCN reaction causing severe rash involving mucus membranes or skin necrosis: No Has patient had a PCN reaction that required hospitalization No Has patient had a PCN reaction occurring within the last 10 years: No If all of the above answers are "NO",  then may proceed with Cephalosporin use. ALL ARE NO    Medications:  Current Outpatient Medications:    chlorhexidine (PERIDEX) 0.12 % solution, Use as directed 15 mLs in the mouth or throat 2 (two) times daily., Disp: 473 mL, Rfl: 0   clindamycin (CLEOCIN) 300 MG capsule, Take 1 capsule (300 mg total) by mouth 3 (three) times daily for 10 days., Disp: 30 capsule, Rfl: 0    amLODipine (NORVASC) 5 MG tablet, Take 5 mg by mouth daily., Disp: , Rfl:    aspirin EC 81 MG tablet, Take 81 mg by mouth daily., Disp: , Rfl:    buprenorphine-naloxone (SUBOXONE) 8-2 mg SUBL SL tablet, Place 1 tablet under the tongue every 12 (twelve) hours., Disp: , Rfl:    gabapentin (NEURONTIN) 300 MG capsule, Take 600-3,000 mg by mouth 3 (three) times daily with meals. 600mg  three times daily with meals and 10 capsules daily at bedtime , Disp: , Rfl:    ibuprofen (ADVIL,MOTRIN) 200 MG tablet, Take 200 mg by mouth every 6 (six) hours as needed for mild pain or moderate pain., Disp: , Rfl:    levETIRAcetam (KEPPRA) 500 MG tablet, Take 1 tablet three times a day, Disp: 90 tablet, Rfl: 11   QUEtiapine (SEROQUEL) 100 MG tablet, Take 3 tablets (300 mg total) by mouth at bedtime., Disp: 60 tablet, Rfl: 0  Observations/Objective: Patient is well-developed, well-nourished in no acute distress.  Resting comfortably  at home.  Head is normocephalic, atraumatic.  No labored breathing.  Speech is clear and coherent with logical content.  Patient is alert and oriented at baseline.    Assessment and Plan: 1. Abscessed tooth - clindamycin (CLEOCIN) 300 MG capsule; Take 1 capsule (300 mg total) by mouth 3 (three) times daily for 10 days.  Dispense: 30 capsule; Refill: 0 - chlorhexidine (PERIDEX) 0.12 % solution; Use as directed 15 mLs in the mouth or throat 2 (two) times daily.  Dispense: 473 mL; Refill: 0 NEEDS TO SEE A DENTIST IMMEDIATELY    Follow Up Instructions: I discussed the assessment and treatment plan with the patient. The patient was provided an opportunity to ask questions and all were answered. The patient agreed with the plan and demonstrated an understanding of the instructions.  A copy of instructions were sent to the patient via MyChart unless otherwise noted below.    The patient was advised to call back or seek an in-person evaluation if the symptoms worsen or if the condition  fails to improve as anticipated.  Time:  I spent 11 minutes with the patient via telehealth technology discussing the above problems/concerns.    , NP

## 2022-04-23 ENCOUNTER — Telehealth: Payer: Medicaid Other | Admitting: Physician Assistant

## 2022-04-23 ENCOUNTER — Emergency Department: Admit: 2022-04-23 | Discharge status: Absent without leave | Payer: Self-pay

## 2022-04-23 ENCOUNTER — Telehealth: Payer: Self-pay

## 2022-04-23 DIAGNOSIS — Z91199 Patient's noncompliance with other medical treatment and regimen due to unspecified reason: Secondary | ICD-10-CM

## 2022-04-23 NOTE — Telephone Encounter (Signed)
Phone call made to pt re on the way visit. Just informed that providers in [facility] do not rx sleeping pills. Pt offered teledoc or ED visit options. Will try that route instead.

## 2022-05-18 ENCOUNTER — Telehealth: Payer: Medicaid Other | Admitting: Emergency Medicine

## 2022-05-18 DIAGNOSIS — Z760 Encounter for issue of repeat prescription: Secondary | ICD-10-CM

## 2022-05-18 MED ORDER — GABAPENTIN 300 MG PO CAPS: 600.0000 mg | ORAL_CAPSULE | Freq: Three times a day (TID) | ORAL | 0 refills | Status: DC

## 2022-05-18 NOTE — Progress Notes (Signed)
Virtual Visit Consent   Christina Nelson, you are scheduled for a virtual visit with a [facility] provider today. Just as with appointments in the office, your consent must be obtained to participate. Your consent will be active for this visit and any virtual visit you may have with one of our providers in the next 365 days. If you have a MyChart account, a copy of this consent can be sent to you electronically.  As this is a virtual visit, video technology does not allow for your provider to perform a traditional examination. This may limit your provider's ability to fully assess your condition. If your provider identifies any concerns that need to be evaluated in person or the need to arrange testing (such as labs, EKG, etc.), we will make arrangements to do so. Although advances in technology are sophisticated, we cannot ensure that it will always work on either your end or our end. If the connection with a video visit is poor, the visit may have to be switched to a telephone visit. With either a video or telephone visit, we are not always able to ensure that we have a secure connection.  By engaging in this virtual visit, you consent to the provision of healthcare and authorize for your insurance to be billed (if applicable) for the services provided during this visit. Depending on your insurance coverage, you may receive a charge related to this service.  I need to obtain your verbal consent now. Are you willing to proceed with your visit today? Christina Nelson has provided verbal consent on 05/18/2022 for a virtual visit (video or telephone). Christina Horseman, PA-C  Date: 05/18/2022 11:28 AM  Virtual Visit via Video Note   I, Christina Nelson, connected with  Christina Nelson  ([id number], 1981-06-11) on 05/18/22 at 11:30 AM EDT by a video-enabled telemedicine application and verified that I am speaking with the correct person using two identifiers.  Location: Patient: Virtual Visit Location Patient:  Home Provider: Virtual Visit Location Provider: Home Office   I discussed the limitations of evaluation and management by telemedicine and the availability of in person appointments. The patient expressed understanding and agreed to proceed.    History of Present Illness: Christina Nelson is a 41 y.o. who identifies as a female who was assigned female at birth, and is being seen today for medication refill.  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   Tobacco abuse 05/31/2017   Seizure-like activity (HCC) 05/31/2017   Anxiety with depression 05/31/2017    Allergies:  Allergies  Allergen Reactions   Amoxicillin Hives    Breaks out next day   Penicillins Hives    Has patient had a PCN reaction causing immediate rash, facial/tongue/throat swelling, SOB or lightheadedness with hypotension: NO Has patient had a PCN reaction causing severe rash involving mucus membranes or skin necrosis: No Has patient had a PCN reaction that required hospitalization No Has patient had a PCN reaction occurring within the last 10 years: No If all of the above answers are "NO", then may proceed with Cephalosporin use. ALL ARE NO    Medications:  Current Outpatient Medications:    amLODipine (NORVASC) 5 MG tablet, Take 5 mg by mouth daily., Disp: , Rfl:    aspirin EC 81 MG tablet, Take 81 mg by mouth daily., Disp: ,  Rfl:    buprenorphine-naloxone (SUBOXONE) 8-2 mg SUBL SL tablet, Place 1 tablet under the tongue every 12 (twelve) hours., Disp: , Rfl:    chlorhexidine (PERIDEX) 0.12 % solution, Use as directed 15 mLs in the mouth or throat 2 (two) times daily., Disp: 473 mL, Rfl: 0   gabapentin (NEURONTIN) 300 MG capsule, Take 600-3,000 mg by mouth 3 (three) times daily with meals. 600mg  three times daily with meals and 10 capsules daily at bedtime , Disp: , Rfl:     ibuprofen (ADVIL,MOTRIN) 200 MG tablet, Take 200 mg by mouth every 6 (six) hours as needed for mild pain or moderate pain., Disp: , Rfl:    levETIRAcetam (KEPPRA) 500 MG tablet, Take 1 tablet three times a day, Disp: 90 tablet, Rfl: 11   QUEtiapine (SEROQUEL) 100 MG tablet, Take 3 tablets (300 mg total) by mouth at bedtime., Disp: 60 tablet, Rfl: 0  Observations/Objective: Patient is well-developed, well-nourished in no acute distress.  Resting comfortably  at home.  Head is normocephalic, atraumatic.  No labored breathing.  Speech is clear and coherent with logical content.  Patient is alert and oriented at baseline.    Assessment and Plan: 1. Medication refill  - Gabapentin refilled per request with authorization from Dr. .  Patient advised to follow-up with [facility].  Follow Up Instructions: I discussed the assessment and treatment plan with the patient. The patient was provided an opportunity to ask questions and all were answered. The patient agreed with the plan and demonstrated an understanding of the instructions.  A copy of instructions were sent to the patient via MyChart unless otherwise noted below.     The patient was advised to call back or seek an in-person evaluation if the symptoms worsen or if the condition fails to improve as anticipated.  Time:  I spent 12 minutes with the patient via telehealth technology discussing the above problems/concerns.    [facility], PA-C

## 2022-05-18 NOTE — Patient Instructions (Addendum)
[email or web address] [address], [place], [place] [address]  [phone]

## 2022-06-21 DIAGNOSIS — Z760 Encounter for issue of repeat prescription: Secondary | ICD-10-CM | POA: Insufficient documentation

## 2022-06-27 ENCOUNTER — Encounter (HOSPITAL_COMMUNITY): Payer: Self-pay

## 2022-06-27 ENCOUNTER — Emergency Department (HOSPITAL_COMMUNITY)
Admission: EM | Admit: 2022-06-27 | Discharge: 2022-06-27 | Disposition: A | Discharge status: Home / Self Care | Payer: Medicaid Other | Attending: Emergency Medicine | Admitting: Emergency Medicine

## 2022-06-27 ENCOUNTER — Emergency Department (HOSPITAL_COMMUNITY): Payer: Medicaid Other

## 2022-06-27 ENCOUNTER — Other Ambulatory Visit: Payer: Self-pay

## 2022-06-27 DIAGNOSIS — R1013 Epigastric pain: Secondary | ICD-10-CM | POA: Insufficient documentation

## 2022-06-27 DIAGNOSIS — K0889 Other specified disorders of teeth and supporting structures: Secondary | ICD-10-CM | POA: Diagnosis not present

## 2022-06-27 DIAGNOSIS — K5900 Constipation, unspecified: Secondary | ICD-10-CM | POA: Diagnosis not present

## 2022-06-27 DIAGNOSIS — Z7982 Long term (current) use of aspirin: Secondary | ICD-10-CM | POA: Diagnosis not present

## 2022-06-27 DIAGNOSIS — Z79899 Other long term (current) drug therapy: Secondary | ICD-10-CM | POA: Diagnosis not present

## 2022-06-27 DIAGNOSIS — R110 Nausea: Secondary | ICD-10-CM | POA: Diagnosis not present

## 2022-06-27 DIAGNOSIS — N9489 Other specified conditions associated with female genital organs and menstrual cycle: Secondary | ICD-10-CM | POA: Insufficient documentation

## 2022-06-27 DIAGNOSIS — M545 Low back pain, unspecified: Secondary | ICD-10-CM | POA: Insufficient documentation

## 2022-06-27 LAB — COMPREHENSIVE METABOLIC PANEL
ALT: 27 U/L (ref 0–44)
AST: 26 U/L (ref 15–41)
Albumin: 4.2 g/dL (ref 3.5–5.0)
Alkaline Phosphatase: 107 U/L (ref 38–126)
Anion gap: 7 (ref 5–15)
BUN: 16 mg/dL (ref 6–20)
CO2: 27 mmol/L (ref 22–32)
Calcium: 9.3 mg/dL (ref 8.9–10.3)
Chloride: 105 mmol/L (ref 98–111)
Creatinine, Ser: 0.46 mg/dL (ref 0.44–1.00)
GFR, Estimated: >60 mL/min (ref >60)
Glucose, Bld: 100 mg/dL — ABNORMAL HIGH (ref 70–99)
Potassium: 4.3 mmol/L (ref 3.5–5.1)
Sodium: 139 mmol/L (ref 135–145)
Total Bilirubin: 0.4 mg/dL (ref 0.3–1.2)
Total Protein: 7.7 g/dL (ref 6.5–8.1)

## 2022-06-27 LAB — CBC WITH DIFFERENTIAL/PLATELET
Abs Immature Granulocytes: 0.03 10*3/uL (ref 0.00–0.07)
Basophils Absolute: 0.1 10*3/uL (ref 0.0–0.1)
Basophils Relative: 1 %
Eosinophils Absolute: 0.2 10*3/uL (ref 0.0–0.5)
Eosinophils Relative: 3 %
HCT: 41.7 % (ref 36.0–46.0)
Hemoglobin: 14.3 g/dL (ref 12.0–15.0)
Immature Granulocytes: 0 %
Lymphocytes Relative: 24 %
Lymphs Abs: 2.2 10*3/uL (ref 0.7–4.0)
MCH: 30.4 pg (ref 26.0–34.0)
MCHC: 34.3 g/dL (ref 30.0–36.0)
MCV: 88.7 fL (ref 80.0–100.0)
Monocytes Absolute: 0.5 10*3/uL (ref 0.1–1.0)
Monocytes Relative: 6 %
Neutro Abs: 6.1 10*3/uL (ref 1.7–7.7)
Neutrophils Relative %: 66 %
Platelets: 293 10*3/uL (ref 150–400)
RBC: 4.7 MIL/uL (ref 3.87–5.11)
RDW: 12.6 % (ref 11.5–15.5)
WBC: 9.2 10*3/uL (ref 4.0–10.5)
nRBC: 0 % (ref 0.0–0.2)

## 2022-06-27 LAB — I-STAT BETA HCG BLOOD, ED (MC, WL, AP ONLY): I-stat hCG, quantitative: <5 m[IU]/mL (ref <5)

## 2022-06-27 LAB — LIPASE, BLOOD: Lipase: 32 U/L (ref 11–51)

## 2022-06-27 LAB — URINALYSIS, ROUTINE W REFLEX MICROSCOPIC
Bilirubin Urine: NEGATIVE
Glucose, UA: NEGATIVE mg/dL
Hgb urine dipstick: NEGATIVE
Ketones, ur: NEGATIVE mg/dL
Nitrite: NEGATIVE
Protein, ur: NEGATIVE mg/dL
Specific Gravity, Urine: 1.021 (ref 1.005–1.030)
pH: 5 (ref 5.0–8.0)

## 2022-06-27 MED ORDER — FAMOTIDINE 40 MG PO TABS: 20.0000 mg | ORAL_TABLET | Freq: Two times a day (BID) | ORAL | 0 refills | Status: AC

## 2022-06-27 MED ORDER — DICYCLOMINE HCL 10 MG PO CAPS
10.0000 mg | ORAL_CAPSULE | Freq: Once | ORAL | Status: AC
Start: 2022-06-27 — End: 2022-06-27
  Administered 2022-06-27: 10 mg via ORAL
  Filled 2022-06-27: qty 1

## 2022-06-27 MED ORDER — FAMOTIDINE IN NACL 20-0.9 MG/50ML-% IV SOLN
20.0000 mg | Freq: Once | INTRAVENOUS | Status: AC
  Administered 2022-06-27: 20 mg via INTRAVENOUS
  Filled 2022-06-27: qty 50

## 2022-06-27 MED ORDER — DICYCLOMINE HCL 20 MG PO TABS: 20.0000 mg | ORAL_TABLET | Freq: Two times a day (BID) | ORAL | 0 refills | Status: AC

## 2022-06-27 MED ORDER — ONDANSETRON HCL 4 MG/2ML IJ SOLN
4.0000 mg | Freq: Once | INTRAMUSCULAR | Status: AC
  Administered 2022-06-27: 4 mg via INTRAVENOUS
  Filled 2022-06-27: qty 2

## 2022-06-27 MED ORDER — CLINDAMYCIN HCL 150 MG PO CAPS: 450.0000 mg | ORAL_CAPSULE | Freq: Three times a day (TID) | ORAL | 0 refills | Status: AC

## 2022-06-27 MED ORDER — MORPHINE SULFATE (PF) 4 MG/ML IV SOLN: 4.0000 mg | Freq: Once | INTRAVENOUS | Status: DC

## 2022-06-27 MED ORDER — ALUM & MAG HYDROXIDE-SIMETH 200-200-20 MG/5ML PO SUSP
15.0000 mL | Freq: Once | ORAL | Status: AC
  Administered 2022-06-27: 15 mL via ORAL
  Filled 2022-06-27: qty 30

## 2022-06-27 MED ORDER — SODIUM CHLORIDE 0.9 % IV BOLUS
1000.0000 mL | Freq: Once | INTRAVENOUS | Status: AC
  Administered 2022-06-27: 1000 mL via INTRAVENOUS

## 2022-06-27 MED ORDER — IOHEXOL 300 MG/ML  SOLN
100.0000 mL | Freq: Once | INTRAMUSCULAR | Status: AC | PRN
  Administered 2022-06-27: 100 mL via INTRAVENOUS

## 2022-06-27 MED ORDER — DICYCLOMINE HCL 10 MG/ML IM SOLN: 20.0000 mg | Freq: Once | INTRAMUSCULAR | Status: DC

## 2022-06-27 NOTE — ED Provider Notes (Signed)
[facility] EMERGENCY DEPARTMENT Provider Note   CSN: [id number] Arrival date & time: 06/27/22  8657     History  Chief Complaint  Patient presents with   Abdominal Pain   Back Pain    Christina Nelson is a 41 y.o. female.  HPI   41 year old female presents emergency department with complaints of abdominal pain/thoracic back pain.  Patient states that symptoms began approximately 1 week ago and have been progressively getting slightly worse.  She notes increase in pain with consumption of food.  She notes some associated nausea with no vomiting.  She denies hematochezia/melena.  She states that she at baseline deals with intermittent constipation.  She states that she has been having to use stool softeners, Dulcolax, Pepto-Bismol for symptoms.  She states she has also been taking ibuprofen 4-5 times a day for the past 2 to 3 months secondary to dental pain..  Last bowel movement was yesterday.  She denies fever, chills, night sweats, chest pain, shortness of breath, urinary symptoms.  She states her last period was approximately 4 days ago.  She said her menstrual cycles have been irregular for 1 to 2 years states that her prior one was approximately 6 months ago.  She denies any vaginal discharge.  Denies weakness/sensory deficits in lower extremities, history of IV drug use, saddle anesthesia, bowel/bladder dysfunction, history of cancer.  Past medical history significant for back pain allergy, anxiety, seizures, migraines  Home Medications Prior to Admission medications   Medication Sig Start Date End Date Taking? Authorizing Provider  clindamycin (CLEOCIN) 150 MG capsule Take 3 capsules (450 mg total) by mouth 3 (three) times daily for 10 days. 06/27/22 07/07/22 Yes Dion Saucier A, PA  dicyclomine (BENTYL) 20 MG tablet Take 1 tablet (20 mg total) by mouth 2 (two) times daily. 06/27/22  Yes Dion Saucier A, PA  famotidine (PEPCID) 40 MG tablet Take 0.5 tablets (20 mg total) by mouth 2  (two) times daily. 06/27/22  Yes Dion Saucier A, PA  amLODipine (NORVASC) 5 MG tablet Take 5 mg by mouth daily.    [provider]  aspirin EC 81 MG tablet Take 81 mg by mouth daily.    [provider]  buprenorphine-naloxone (SUBOXONE) 8-2 mg SUBL SL tablet Place 1 tablet under the tongue every 12 (twelve) hours.    [provider]  chlorhexidine (PERIDEX) 0.12 % solution Use as directed 15 mLs in the mouth or throat 2 (two) times daily. 04/04/22   Gildardo Pounds, NP  gabapentin (NEURONTIN) 300 MG capsule Take 2 capsules (600 mg total) by mouth 3 (three) times daily with meals. $RemoveBef'600mg'hKyrQFeGDi$  three times daily with meals and 10 capsules daily at bedtime 05/18/22   Montine Circle, PA-C  ibuprofen (ADVIL,MOTRIN) 200 MG tablet Take 200 mg by mouth every 6 (six) hours as needed for mild pain or moderate pain.    [provider]  levETIRAcetam (KEPPRA) 500 MG tablet Take 1 tablet three times a day 12/10/19   Cameron Sprang, MD  QUEtiapine (SEROQUEL) 100 MG tablet Take 3 tablets (300 mg total) by mouth at bedtime. 12/24/21   Hassell Done Mary-Margaret, FNP      Allergies    Amoxicillin and Penicillins    Review of Systems   Review of Systems  All other systems reviewed and are negative.   Physical Exam Updated Vital Signs BP 134/85   Pulse 64   Temp 97.7 F (36.5 C) (Oral)   Resp 16   Ht 5'  3" (1.6 m)   Wt 77.1 kg   LMP 06/24/2022 (Exact Date)   SpO2 97%   BMI 30.11 kg/m  Physical Exam Vitals and nursing note reviewed.  Constitutional:      General: She is not in acute distress.    Appearance: She is well-developed. She is not ill-appearing, toxic-appearing or diaphoretic.  HENT:     Head: Normocephalic and atraumatic.     Mouth/Throat:     Comments: Uvula midline and rises symmetrically with phonation.  No posterior pharyngeal erythema noted.  Tonsils 0-1+ bilaterally with no obvious exudate.  Multiple dental caries noted on patient's left upper teeth with  cracked/chipped teeth..  No obvious periapical abscess.  No edema noticed beneath the tongue or submandibular swelling.  Patient tolerating oral secretions well and has no difficulty breathing or swallowing. Eyes:     Conjunctiva/sclera: Conjunctivae normal.  Cardiovascular:     Rate and Rhythm: Normal rate and regular rhythm.     Pulses: Normal pulses.     Heart sounds: No murmur heard. Pulmonary:     Effort: Pulmonary effort is normal. No respiratory distress.     Breath sounds: Normal breath sounds. No wheezing or rales.  Abdominal:     Palpations: Abdomen is soft.     Tenderness: There is abdominal tenderness. There is no right CVA tenderness or left CVA tenderness. Negative signs include Murphy's sign.     Comments: Mild epigastric/right upper quadrant tenderness.  Musculoskeletal:        General: No swelling.     Cervical back: Neck supple.     Right lower leg: No edema.     Left lower leg: No edema.  Skin:    General: Skin is warm and dry.     Capillary Refill: Capillary refill takes less than 2 seconds.  Neurological:     Mental Status: She is alert.  Psychiatric:        Mood and Affect: Mood normal.     ED Results / Procedures / Treatments   Labs (all labs ordered are listed, but only abnormal results are displayed) Labs Reviewed  COMPREHENSIVE METABOLIC PANEL - Abnormal; Notable for the following components:      Result Value   Glucose, Bld 100 (*)    All other components within normal limits  URINALYSIS, ROUTINE W REFLEX MICROSCOPIC - Abnormal; Notable for the following components:   APPearance HAZY (*)    Leukocytes,Ua TRACE (*)    Bacteria, UA FEW (*)    All other components within normal limits  CBC WITH DIFFERENTIAL/PLATELET  LIPASE, BLOOD  I-STAT BETA HCG BLOOD, ED ([facility], [facility], [facility] ONLY)    EKG None  Radiology CT Abdomen Pelvis W Contrast  Result Date: 06/27/2022 CLINICAL DATA:  Epigastric and lower abdominal pain for several days. EXAM: CT ABDOMEN  AND PELVIS WITH CONTRAST TECHNIQUE: Multidetector CT imaging of the abdomen and pelvis was performed using the standard protocol following bolus administration of intravenous contrast. RADIATION DOSE REDUCTION: This exam was performed according to the departmental dose-optimization program which includes automated exposure control, adjustment of the mA and/or kV according to patient size and/or use of iterative reconstruction technique. CONTRAST:  11mL OMNIPAQUE IOHEXOL 300 MG/ML  SOLN COMPARISON:  Noncontrast CT on 11/19/2016 FINDINGS: Lower Chest: No acute findings. Hepatobiliary: No hepatic masses identified. Gallbladder is unremarkable. No evidence of biliary ductal dilatation. Pancreas:  No mass or inflammatory changes. Spleen: Within normal limits in size and appearance. Adrenals/Urinary Tract: No suspicious masses identified. No  evidence of ureteral calculi or hydronephrosis. Stomach/Bowel: No evidence of obstruction, inflammatory process or abnormal fluid collections. Normal appendix visualized. Vascular/Lymphatic: No pathologically enlarged lymph nodes. No acute vascular findings. Reproductive:  No mass or other significant abnormality. Other:  None. Musculoskeletal:  No suspicious bone lesions identified. IMPRESSION: Negative. No acute findings or other significant abnormality. Electronically Signed   By: Marlaine Hind M.D.   On: 06/27/2022 12:16    Procedures Procedures    Medications Ordered in ED Medications  dicyclomine (BENTYL) capsule 10 mg (has no administration in time range)  sodium chloride 0.9 % bolus 1,000 mL (1,000 mLs Intravenous New Bag/Given 06/27/22 1102)  ondansetron (ZOFRAN) injection 4 mg (4 mg Intravenous Given 06/27/22 1102)  famotidine (PEPCID) IVPB 20 mg premix (20 mg Intravenous New Bag/Given 06/27/22 1104)  alum & mag hydroxide-simeth (MAALOX/MYLANTA) 200-200-20 MG/5ML suspension 15 mL (15 mLs Oral Given 06/27/22 1100)  iohexol (OMNIPAQUE) 300 MG/ML solution 100 mL (100  mLs Intravenous Contrast Given 06/27/22 1158)    ED Course/ Medical Decision Making/ A&P                           Medical Decision Making Amount and/or Complexity of Data Reviewed Labs: ordered. Radiology: ordered.  Risk OTC drugs. Prescription drug management.   This patient presents to the ED for concern of abdominal pain, this involves an extensive number of treatment options, and is a complaint that carries with it a high risk of complications and morbidity.  The differential diagnosis includes gastritis, gastric ulcer, duodenal ulcer, pancreatitis, perforation, cholecystitis, hepatitis, small bowel obstruction, appendicitis, AAA, aortic dissection, mesenteric ischemia   Co morbidities that complicate the patient evaluation  See HPI   Additional history obtained:  Additional history obtained from EMR  Lab Tests:  I Ordered, and personally interpreted labs.  The pertinent results include: No leukocytosis.  No evidence of anemia.  Platelets within normal range.  No electrolyte abnormality.  No evidence of transaminitis or elevation of alk phos.  Renal function within normal limits.  Beta-hCG negative.  Lipase within normal limits.  Urine sample was dirty with 6-10 squamous cells; patient currently asymptomatic so will not treat despite evidence of trace leukocytes, few bacteria and 6-10 WBC.   Imaging Studies ordered:  I ordered imaging studies including CT abdomen pelvis I independently visualized and interpreted imaging which showed no acute abnormality I agree with the radiologist interpretation   Cardiac Monitoring: / EKG:  The patient was maintained on a cardiac monitor.  I personally viewed and interpreted the cardiac monitored which showed an underlying rhythm of: Sinus rhythm   Consultations Obtained:  N/a   Problem List / ED Course / Critical interventions / Medication management  Abdominal pain I ordered medication including Zofran for nausea, Maalox  and famotidine for epigastric pain.  1 L normal saline for rehydration.   Reevaluation of the patient after these medicines showed that the patient improved I have reviewed the patients home medicines and have made adjustments as needed   Social Determinants of Health:  Current cigarette use.  Denies illicit drug use.   Test / Admission - Considered:  Vitals signs significant for hypertension initially with a blood pressure 153/89.  Blood pressure resolved within normal range with administration of IV fluids. Otherwise within normal range and stable throughout visit. Laboratory/imaging studies significant for: See above Patient's symptoms most likely secondary to gastritis or possible gastric ulcer.  Patient not obviously anemic currently and BUN  16 so doubt gross upper GI bleed.  CT imaging negative for any acute abnormalities.  Patient improved with medicines given in the emergency department.  Doubt dissection.  Doubt mesenteric ischemia.  Symptomatic therapy recommended with proper oral hydration, DC ibuprofen, Maalox, famotidine and Bentyl as needed.  Close follow-up with primary care recommended in 2 to 3 days for reevaluation of symptoms.  GI information will be provided to set up an appointment as well as symptoms persist. Treatment plan were discussed at length with patient and they knowledge understanding was agreeable to said plan.  Worrisome signs and symptoms were discussed with the patient and the patient knowledge understanding was agreeable to said plan.  Patient was stable upon discharge.         Final Clinical Impression(s) / ED Diagnoses Final diagnoses:  Epigastric abdominal pain  Pain, dental    Rx / DC Orders ED Discharge Orders          Ordered    dicyclomine (BENTYL) 20 MG tablet  2 times daily        06/27/22 1258    famotidine (PEPCID) 40 MG tablet  2 times daily        06/27/22 1258    clindamycin (CLEOCIN) 150 MG capsule  3 times daily        06/27/22  1258              Wilnette Kales, [place] 06/27/22 1258    Isla Pence, MD 06/27/22 1557

## 2022-06-27 NOTE — Discharge Instructions (Addendum)
Note your work-up today was overall reassuring.  Since responded well to medications given in emergency department, I will prescribe famotidine, Bentyl as well as an antibiotic for your dental pain.  You can find the oral solution called Maalox over-the-counter at your local pharmacy.  I advise you to quit taking ibuprofen as this irritates the lining of your stomach and could be causing your symptoms.  Take the antibiotic as directed for your dental pain.  Keep your upcoming appointment with your dentist for addressing of your problematic teeth.  Please do not hesitate to return to the emergency department if the worrisome signs and symptoms we discussed become apparent.

## 2022-06-27 NOTE — ED Notes (Signed)
Provider at bedside to assess during triage.

## 2022-06-27 NOTE — ED Triage Notes (Signed)
Pt c/o lower abd pain and mid back pain for a few days. Pt has had constipation and recently had a menstrual cycle. Last bm was yesterday and states pain is worse when she eats. Pt also c/o left tooth abscess.

## 2022-07-06 ENCOUNTER — Telehealth: Payer: Self-pay | Admitting: Gastroenterology

## 2022-07-06 NOTE — Telephone Encounter (Signed)
This patient left [place] a message to call her.... she said that she was seen last week in the ER and they referred her to our office.    She has abdominal/back pain, when she eats she feels movement in her stomach, "something is not right".  I tried to scheduled for 9/19 and she asked "what am I supposed to do until then".  I wasn't sure where to schedule her and told her that I would have to ask someone and call her back.

## 2022-07-06 NOTE — Telephone Encounter (Signed)
She has never been seen before so she would need to be approved as a new patient referral.I believe Luster Landsberg has been doing these. However,  I don't see any reason why we would not see her since no evidence of established GI care elsewhere.   There is a new patient appointment available tomorrow with Baxter Hire.

## 2022-07-07 ENCOUNTER — Telehealth: Payer: Medicaid Other | Admitting: Physician Assistant

## 2022-07-07 DIAGNOSIS — Z91199 Patient's noncompliance with other medical treatment and regimen due to unspecified reason: Secondary | ICD-10-CM

## 2022-07-07 NOTE — Progress Notes (Signed)
The patient no-showed for appointment despite this provider sending direct link x 2 with no response and waiting for at least 10 minutes from appointment time for patient to join. They will be marked as a NS for this appointment/time.   Joshawa Dubin M Ronne Savoia, PA-C    

## 2022-07-08 ENCOUNTER — Emergency Department (HOSPITAL_COMMUNITY)
Admission: EM | Admit: 2022-07-08 | Discharge: 2022-07-08 | Disposition: A | Discharge status: Home / Self Care | Payer: Medicaid Other | Attending: Emergency Medicine | Admitting: Emergency Medicine

## 2022-07-08 ENCOUNTER — Other Ambulatory Visit: Payer: Self-pay

## 2022-07-08 ENCOUNTER — Encounter (HOSPITAL_COMMUNITY): Payer: Self-pay | Admitting: *Deleted

## 2022-07-08 DIAGNOSIS — Z79899 Other long term (current) drug therapy: Secondary | ICD-10-CM | POA: Diagnosis not present

## 2022-07-08 DIAGNOSIS — K0889 Other specified disorders of teeth and supporting structures: Secondary | ICD-10-CM | POA: Insufficient documentation

## 2022-07-08 DIAGNOSIS — R030 Elevated blood-pressure reading, without diagnosis of hypertension: Secondary | ICD-10-CM

## 2022-07-08 DIAGNOSIS — Z760 Encounter for issue of repeat prescription: Secondary | ICD-10-CM

## 2022-07-08 MED ORDER — AMLODIPINE BESYLATE 5 MG PO TABS
5.0000 mg | ORAL_TABLET | Freq: Every day | ORAL | 0 refills | Status: AC
Start: 2022-07-08 — End: ?

## 2022-07-08 MED ORDER — GABAPENTIN 300 MG PO CAPS: 300.0000 mg | ORAL_CAPSULE | Freq: Three times a day (TID) | ORAL | 0 refills | Status: DC

## 2022-07-08 MED ORDER — CLONIDINE HCL 0.1 MG PO TABS
0.1000 mg | ORAL_TABLET | Freq: Two times a day (BID) | ORAL | 0 refills | Status: AC
Start: 2022-07-08 — End: ?

## 2022-07-08 MED ORDER — LIDOCAINE VISCOUS HCL 2 % MT SOLN
15.0000 mL | Freq: Once | OROMUCOSAL | Status: AC
Start: 2022-07-08 — End: 2022-07-08
  Administered 2022-07-08: 15 mL via OROMUCOSAL
  Filled 2022-07-08: qty 15

## 2022-07-08 MED ORDER — LIDOCAINE VISCOUS HCL 2 % MT SOLN: 15.0000 mL | OROMUCOSAL | 0 refills | Status: DC | PRN

## 2022-07-08 MED ORDER — KETOROLAC TROMETHAMINE 15 MG/ML IJ SOLN
15.0000 mg | Freq: Once | INTRAMUSCULAR | Status: AC
  Administered 2022-07-08: 15 mg via INTRAMUSCULAR
  Filled 2022-07-08: qty 1

## 2022-07-08 NOTE — ED Triage Notes (Signed)
Pt c/o left side facial pain and ear pain; pt states she has some bad teeth and has been on an antibiotic and used mouth wash with no relief  Pt states she has a dental appointment next week

## 2022-07-08 NOTE — Discharge Instructions (Signed)
Please follow-up with your dentist.  I have refilled the medications that we talked about.  I also given you contact information for a primary care doctor for further evaluation.  Please return to the emerge department for any worsening symptoms you might have.

## 2022-07-08 NOTE — ED Provider Notes (Signed)
[facility] EMERGENCY DEPARTMENT Provider Note   CSN: [id number] Arrival date & time: 07/08/22  2585     History Chief Complaint  Patient presents with   Dental Pain    Christina Nelson is a 41 y.o. female patient who presents to the emergency department today for evaluation of left upper dental pain that started yesterday.  Patient has an appointment with a dentist next week and has been on antibiotics for another dental infection.  She does have a penicillin allergy and has been on clindamycin.  She had a phone visit yesterday with another provider and was also just placed on clarithromycin.  She has been trying over-the-counter Tylenol and ibuprofen with little relief.  Patient is currently on Suboxone and does not wish to have any narcotics today.   Dental Pain      Home Medications Prior to Admission medications   Medication Sig Start Date End Date Taking? Authorizing Provider  amLODipine (NORVASC) 5 MG tablet Take 1 tablet (5 mg total) by mouth daily. 07/08/22  Yes Meredeth Ide, Giannah Zavadil M, PA-C  buprenorphine-naloxone (SUBOXONE) 8-2 mg SUBL SL tablet Place 1 tablet under the tongue every 12 (twelve) hours.   Yes [provider]  chlorhexidine (PERIDEX) 0.12 % solution Use as directed 15 mLs in the mouth or throat 2 (two) times daily. Patient taking differently: Use as directed 15 mLs in the mouth or throat 2 (two) times daily as needed (mouth pain). 04/04/22  Yes Claiborne Rigg, NP  clarithromycin (BIAXIN) 500 MG tablet Take 500 mg by mouth 2 (two) times daily. 07/07/22  Yes [provider]  cloNIDine (CATAPRES) 0.1 MG tablet Take 1 tablet (0.1 mg total) by mouth 2 (two) times daily. 07/08/22  Yes Meredeth Ide, Lincon Sahlin M, PA-C  dicyclomine (BENTYL) 20 MG tablet Take 1 tablet (20 mg total) by mouth 2 (two) times daily. 06/27/22  Yes Sherian Maroon A, PA  famotidine (PEPCID) 40 MG tablet Take 0.5 tablets (20 mg total) by mouth 2 (two) times daily. 06/27/22  Yes Sherian Maroon A, PA   gabapentin (NEURONTIN) 300 MG capsule Take 1 capsule (300 mg total) by mouth 3 (three) times daily. 07/08/22  Yes Meredeth Ide, Jamella Grayer M, PA-C  ibuprofen (ADVIL,MOTRIN) 200 MG tablet Take 200 mg by mouth every 6 (six) hours as needed for mild pain or moderate pain.   Yes [provider]  levETIRAcetam (KEPPRA) 500 MG tablet Take 1 tablet three times a day Patient taking differently: Take 500 mg by mouth in the morning, at noon, and at bedtime. 12/10/19  Yes Van Clines, MD  lidocaine (XYLOCAINE) 2 % solution Use as directed 15 mLs in the mouth or throat as needed for mouth pain. 07/08/22  Yes Honor Loh M, PA-C      Allergies    Amoxicillin and Penicillins    Review of Systems   Review of Systems  All other systems reviewed and are negative.   Physical Exam Updated Vital Signs BP (!) 155/107 (BP Location: Right Arm)   Pulse (!) 104   Temp 98.1 F (36.7 C) (Oral)   Resp 18   Ht 5\' 3"  (1.6 m)   Wt 77.1 kg   LMP 06/24/2022 (Exact Date)   SpO2 98%   BMI 30.11 kg/m  Physical Exam Vitals and nursing note reviewed.  Constitutional:      Appearance: Normal appearance.  HENT:     Head: Normocephalic and atraumatic.     Mouth/Throat:     Comments: Poor dentition  with numerous dental caries and missing teeth.  There is some slight gingival swelling at the left upper gumline near the premolars.  No obvious fluctuance.  Uvula is midline.  No posterior pharyngeal erythema or exudate.  Patient talking in complete sentences.  Tongue is normal.  No swelling of the oral floor. Eyes:     General:        Right eye: No discharge.        Left eye: No discharge.     Conjunctiva/sclera: Conjunctivae normal.  Pulmonary:     Effort: Pulmonary effort is normal.  Skin:    General: Skin is warm and dry.     Findings: No rash.  Neurological:     General: No focal deficit present.     Mental Status: She is alert.  Psychiatric:        Mood and Affect: Mood normal.        Behavior:  Behavior normal.     ED Results / Procedures / Treatments   Labs (all labs ordered are listed, but only abnormal results are displayed) Labs Reviewed - No data to display  EKG None  Radiology No results found.  Procedures Procedures    Medications Ordered in ED Medications  lidocaine (XYLOCAINE) 2 % viscous mouth solution 15 mL (15 mLs Mouth/Throat Given 07/08/22 1007)  ketorolac (TORADOL) 15 MG/ML injection 15 mg (15 mg Intramuscular Given 07/08/22 1006)    ED Course/ Medical Decision Making/ A&P Clinical Course as of 07/08/22 1038  Thu Jul 08, 2022  1025 On reevaluation, patient is feeling better.  Patient cannot take any ibuprofen as she is currently being worked up in the outpatient setting for possible gastric ulcers.  She is yet to follow-up with a gastroenterologist.  She also states her primary care physician just passed away and has not had any refill on her medications including gabapentin or clonidine.  In addition, patient states that her blood pressure is also been elevated and she used to be on antihypertensives at 1 point but is not currently on anything. [CF]    Clinical Course User Index [CF] Christina Lower, PA-C                           Medical Decision Making [name] is a 41 y.o. female patient who presents to the emergency department today for further evaluation of dental pain.  Patient already on antibiotics.  No signs of PTA, RPA, or Ludwig's angina.  We will plan to give her some Toradol and viscous lidocaine to allow her swish and spit.  I will plan to reassess.  She is nontoxic-appearing.  Patient feeling better after medications.  As highlighted in ED course, I will refill her clonidine and gabapentin.  We will also place her on Xylocaine viscus solution to help with the dental pain.  I instructed her how to appropriately take Tylenol as she was taking way too much.  Patient's blood pressure was also elevated here which could be related to pain  although she states it has been elevated in the outpatient setting as well.  She is not currently on any antihypertensives.  I will give her 5 mg amlodipine and a referral for primary care to get established and further management.  Strict return precautions were discussed.  She is safe for discharge at this time.  Risk Prescription drug management.    Final Clinical Impression(s) / ED Diagnoses Final diagnoses:  Pain,  dental  Elevated blood pressure reading  Medication refill    Rx / DC Orders ED Discharge Orders          Ordered    gabapentin (NEURONTIN) 300 MG capsule  3 times daily        07/08/22 1035    cloNIDine (CATAPRES) 0.1 MG tablet  2 times daily        07/08/22 1035    lidocaine (XYLOCAINE) 2 % solution  As needed        07/08/22 1035    amLODipine (NORVASC) 5 MG tablet  Daily        07/08/22 1035              Honor Loh [place], [place] 07/08/22 1038    Terrilee Files, MD 07/08/22 1739

## 2022-07-12 ENCOUNTER — Encounter: Payer: Self-pay | Admitting: *Deleted

## 2022-07-20 ENCOUNTER — Telehealth: Payer: Medicaid Other | Admitting: Physician Assistant

## 2022-07-20 DIAGNOSIS — K047 Periapical abscess without sinus: Secondary | ICD-10-CM

## 2022-07-20 MED ORDER — CEFUROXIME AXETIL 500 MG PO TABS: 500.0000 mg | ORAL_TABLET | Freq: Two times a day (BID) | ORAL | 0 refills | Status: AC

## 2022-07-20 NOTE — Progress Notes (Signed)
Virtual Visit Consent   Christina Nelson, you are scheduled for a virtual visit with a [facility] provider today. Just as with appointments in the office, your consent must be obtained to participate. Your consent will be active for this visit and any virtual visit you may have with one of our providers in the next 365 days. If you have a MyChart account, a copy of this consent can be sent to you electronically.  As this is a virtual visit, video technology does not allow for your provider to perform a traditional examination. This may limit your provider's ability to fully assess your condition. If your provider identifies any concerns that need to be evaluated in person or the need to arrange testing (such as labs, EKG, etc.), we will make arrangements to do so. Although advances in technology are sophisticated, we cannot ensure that it will always work on either your end or our end. If the connection with a video visit is poor, the visit may have to be switched to a telephone visit. With either a video or telephone visit, we are not always able to ensure that we have a secure connection.  By engaging in this virtual visit, you consent to the provision of healthcare and authorize for your insurance to be billed (if applicable) for the services provided during this visit. Depending on your insurance coverage, you may receive a charge related to this service.  I need to obtain your verbal consent now. Are you willing to proceed with your visit today? Christina Nelson has provided verbal consent on 07/20/2022 for a virtual visit (video or telephone). Christina Nelson, [place]  Date: 07/20/2022 1:43 PM  Virtual Visit via Video Note   I, Christina Nelson, connected with  Christina Nelson  ([id number], 1981/03/03) on 07/20/22 at  1:30 PM EDT by a video-enabled telemedicine application and verified that I am speaking with the correct person using two identifiers.  Location: Patient: Virtual Visit Location  Patient: Home Provider: Virtual Visit Location Provider: Home Office   I discussed the limitations of evaluation and management by telemedicine and the availability of in person appointments. The patient expressed understanding and agreed to proceed.    History of Present Illness: Christina Nelson is a 41 y.o. who identifies as a female who was assigned female at birth, and is being seen today for recurrent dental infection/abscess. Notes recently finishing treatment for abscess last week with Clarithromycin. Notes symptoms were completely resolved but recurred in past few days. Denies fever, chills. Is still upper left tooth, near molar. Has not been able to get in with the dentist. Notes she missed last appointment due to sick child and they still have not given her a date/time for appointment.  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   Tobacco abuse 05/31/2017   Seizure-like activity (HCC) 05/31/2017   Anxiety with depression 05/31/2017    Allergies:  Allergies  Allergen Reactions   Amoxicillin Hives    Breaks out next day   Penicillins Hives    Has patient had a PCN reaction causing immediate rash, facial/tongue/throat swelling, SOB or lightheadedness with hypotension: NO Has patient had a PCN reaction causing severe rash involving mucus membranes or skin necrosis: No Has patient had a PCN reaction that required hospitalization No Has patient had a PCN reaction occurring within the last 10 years: No If all of the above  answers are "NO", then may proceed with Cephalosporin use. ALL ARE NO    Medications:  Current Outpatient Medications:    cefUROXime (CEFTIN) 500 MG tablet, Take 1 tablet (500 mg total) by mouth 2 (two) times daily with a meal for 10 days., Disp: 20 tablet, Rfl: 0   amLODipine (NORVASC) 5 MG tablet, Take 1 tablet (5 mg total) by mouth daily., Disp: 30 tablet, Rfl: 0    buprenorphine-naloxone (SUBOXONE) 8-2 mg SUBL SL tablet, Place 1 tablet under the tongue every 12 (twelve) hours., Disp: , Rfl:    chlorhexidine (PERIDEX) 0.12 % solution, Use as directed 15 mLs in the mouth or throat 2 (two) times daily. (Patient taking differently: Use as directed 15 mLs in the mouth or throat 2 (two) times daily as needed (mouth pain).), Disp: 473 mL, Rfl: 0   clarithromycin (BIAXIN) 500 MG tablet, Take 500 mg by mouth 2 (two) times daily., Disp: , Rfl:    cloNIDine (CATAPRES) 0.1 MG tablet, Take 1 tablet (0.1 mg total) by mouth 2 (two) times daily., Disp: 30 tablet, Rfl: 0   dicyclomine (BENTYL) 20 MG tablet, Take 1 tablet (20 mg total) by mouth 2 (two) times daily., Disp: 20 tablet, Rfl: 0   famotidine (PEPCID) 40 MG tablet, Take 0.5 tablets (20 mg total) by mouth 2 (two) times daily., Disp: 30 tablet, Rfl: 0   gabapentin (NEURONTIN) 300 MG capsule, Take 1 capsule (300 mg total) by mouth 3 (three) times daily., Disp: 90 capsule, Rfl: 0   ibuprofen (ADVIL,MOTRIN) 200 MG tablet, Take 200 mg by mouth every 6 (six) hours as needed for mild pain or moderate pain., Disp: , Rfl:    levETIRAcetam (KEPPRA) 500 MG tablet, Take 1 tablet three times a day (Patient taking differently: Take 500 mg by mouth in the morning, at noon, and at bedtime.), Disp: 90 tablet, Rfl: 11   lidocaine (XYLOCAINE) 2 % solution, Use as directed 15 mLs in the mouth or throat as needed for mouth pain., Disp: 100 mL, Rfl: 0  Observations/Objective: Patient is well-developed, well-nourished in no acute distress.  Resting comfortably at home.  Head is normocephalic, atraumatic.  No labored breathing. Speech is clear and coherent with logical content.  Patient is alert and oriented at baseline.   Assessment and Plan: 1. Dental infection - cefUROXime (CEFTIN) 500 MG tablet; Take 1 tablet (500 mg total) by mouth 2 (two) times daily with a meal for 10 days.  Dispense: 20 tablet; Refill: 0  Giving need to avoid  Clindamycin as she has taken a lot in the past few months. Is penicillin allergic but tolerated cephalosporins in the past Per UTD guidelines will give her Cefuroxime BID for 10 days. Strict UC/ER precautions reviewed. Recommend she even consider going to dentist office in person to get appointment scheduled since having issue with phones.   Follow Up Instructions: I discussed the assessment and treatment plan with the patient. The patient was provided an opportunity to ask questions and all were answered. The patient agreed with the plan and demonstrated an understanding of the instructions.  A copy of instructions were sent to the patient via MyChart unless otherwise noted below.   The patient was advised to call back or seek an in-person evaluation if the symptoms worsen or if the condition fails to improve as anticipated.  Time:  I spent 10 minutes with the patient via telehealth technology discussing the above problems/concerns.    [name], PA-C

## 2022-07-20 NOTE — Patient Instructions (Signed)
  Christina Nelson, thank you for joining [name], PA-C for today's virtual visit.  While this provider is not your primary care provider (PCP), if your PCP is located in our provider database this encounter information will be shared with them immediately following your visit.  Consent: (Patient) Christina Nelson provided verbal consent for this virtual visit at the beginning of the encounter.  Current Medications:  Current Outpatient Medications:    amLODipine (NORVASC) 5 MG tablet, Take 1 tablet (5 mg total) by mouth daily., Disp: 30 tablet, Rfl: 0   buprenorphine-naloxone (SUBOXONE) 8-2 mg SUBL SL tablet, Place 1 tablet under the tongue every 12 (twelve) hours., Disp: , Rfl:    chlorhexidine (PERIDEX) 0.12 % solution, Use as directed 15 mLs in the mouth or throat 2 (two) times daily. (Patient taking differently: Use as directed 15 mLs in the mouth or throat 2 (two) times daily as needed (mouth pain).), Disp: 473 mL, Rfl: 0   clarithromycin (BIAXIN) 500 MG tablet, Take 500 mg by mouth 2 (two) times daily., Disp: , Rfl:    cloNIDine (CATAPRES) 0.1 MG tablet, Take 1 tablet (0.1 mg total) by mouth 2 (two) times daily., Disp: 30 tablet, Rfl: 0   dicyclomine (BENTYL) 20 MG tablet, Take 1 tablet (20 mg total) by mouth 2 (two) times daily., Disp: 20 tablet, Rfl: 0   famotidine (PEPCID) 40 MG tablet, Take 0.5 tablets (20 mg total) by mouth 2 (two) times daily., Disp: 30 tablet, Rfl: 0   gabapentin (NEURONTIN) 300 MG capsule, Take 1 capsule (300 mg total) by mouth 3 (three) times daily., Disp: 90 capsule, Rfl: 0   ibuprofen (ADVIL,MOTRIN) 200 MG tablet, Take 200 mg by mouth every 6 (six) hours as needed for mild pain or moderate pain., Disp: , Rfl:    levETIRAcetam (KEPPRA) 500 MG tablet, Take 1 tablet three times a day (Patient taking differently: Take 500 mg by mouth in the morning, at noon, and at bedtime.), Disp: 90 tablet, Rfl: 11   lidocaine (XYLOCAINE) 2 % solution, Use as directed 15 mLs  in the mouth or throat as needed for mouth pain., Disp: 100 mL, Rfl: 0   Medications ordered in this encounter:  No orders of the defined types were placed in this encounter.    *If you need refills on other medications prior to your next appointment, please contact your pharmacy*  Follow-Up: Call back or seek an in-person evaluation if the symptoms worsen or if the condition fails to improve as anticipated.  Other Instructions   If you have been instructed to have an in-person evaluation today at a local Urgent Care facility, please use the link below. It will take you to a list of all of our available [facility], including address, phone number and hours of operation. Please do not delay care.  [facility]  If you or a family member do not have a primary care provider, use the link below to schedule a visit and establish care. When you choose a [facility] primary care physician or advanced practice provider, you gain a long-term partner in health. Find a Primary Care Provider  Learn more about [facility]'s in-office and virtual care options: [facility]

## 2022-07-24 ENCOUNTER — Telehealth: Payer: Medicaid Other | Admitting: Nurse Practitioner

## 2022-07-24 ENCOUNTER — Encounter: Payer: Medicaid Other | Admitting: Nurse Practitioner

## 2022-07-24 DIAGNOSIS — K047 Periapical abscess without sinus: Secondary | ICD-10-CM

## 2022-07-24 MED ORDER — CLINDAMYCIN HCL 300 MG PO CAPS: 300.0000 mg | ORAL_CAPSULE | Freq: Four times a day (QID) | ORAL | 0 refills | Status: DC

## 2022-07-24 NOTE — Patient Instructions (Signed)
Dental Abscess  A dental abscess is an infection around a tooth that may involve pain, swelling, and a collection of pus, as well as other symptoms. Treatment is important to help with symptoms and to prevent the infection from spreading. The general types of dental abscesses are: Pulpal abscess. This abscess may form from the inner part of the tooth (pulp). Periodontal abscess. This abscess may form from the gum. What are the causes? This condition is caused by a bacterial infection in or around the tooth. It may result from: Severe tooth decay (cavities). Trauma to the tooth, such as a broken or chipped tooth. What increases the risk? This condition is more likely to develop in males. It is also more likely to develop in people who: Have cavities. Have severe gum disease. Eat sugary snacks between meals. Use tobacco products. Have diabetes. Have a weakened disease-fighting system (immune system). Do not brush and care for their teeth regularly. What are the signs or symptoms? Mild symptoms of this condition include: Tenderness. Bad breath. Fever. A bitter taste in the mouth. Pain in and around the infected tooth. Moderate symptoms of this condition include: Swollen neck glands. Chills. Pus drainage. Swelling and redness around the infected tooth, in the mouth, or in the face. Severe pain in and around the infected tooth. Severe symptoms of this condition include: Difficulty swallowing. Difficulty opening the mouth. Nausea. Vomiting. How is this diagnosed? This condition is diagnosed based on: Your symptoms and your medical and dental history. An examination of the infected tooth. During the exam, your dental care provider may tap on the infected tooth. You may also need to have X-rays taken of the affected area. How is this treated? This condition is treated by getting rid of the infection. This may be done with: Antibiotic medicines. These may be used in certain  situations. Antibacterial mouth rinse. Incision and drainage. This procedure is done by making an incision in the abscess to drain out the pus. Removing pus is the first priority in treating an abscess. A root canal. This may be performed to save the tooth. Your dental care provider accesses the visible part of your tooth (crown) with a drill and removes any infected pulp. Then the space is filled and sealed off. Tooth extraction. The tooth is pulled out if it cannot be saved by other treatment. You may also receive treatment for pain, such as: Acetaminophen or NSAIDs. Gels that contain a numbing medicine. An injection to block the pain near your nerve. Follow these instructions at home: Medicines Take over-the-counter and prescription medicines only as told by your dental care provider. If you were prescribed an antibiotic, take it as told by your dental care provider. Do not stop taking the antibiotic even if you start to feel better. If you were prescribed a gel that contains a numbing medicine, use it exactly as told in the directions. Do not use these gels for children who are younger than 2 years of age. Use an antibacterial mouth rinse as told by your dental care provider. General instructions  Gargle with a mixture of salt and water 3-4 times a day or as needed. To make salt water, completely dissolve -1 tsp (3-6 g) of salt in 1 cup (237 mL) of warm water. Eat a soft diet while your abscess is healing. Drink enough fluid to keep your urine pale yellow. Do not apply heat to the outside of your mouth. Do not use any products that contain nicotine or tobacco. These   products include cigarettes, chewing tobacco, and vaping devices, such as e-cigarettes. If you need help quitting, ask your dental care provider. Keep all follow-up visits. This is important. How is this prevented?  Excellent dental home care, which includes brushing your teeth every morning and night with fluoride  toothpaste. Floss one time each day. Get regularly scheduled dental cleanings. Consider having a dental sealant applied on teeth that have deep grooves to prevent cavities. Drink fluoridated water regularly. This includes most tap water. Check the label on bottled water to see if it contains fluoride. Reduce or eliminate sugary drinks. Eat healthy meals and snacks. Wear a mouth guard or face shield to protect your teeth while playing sports. Contact a health care provider if: Your pain is worse and is not helped by medicine. You have swelling. You see pus around the tooth. You have a fever or chills. Get help right away if: Your symptoms suddenly get worse. You have a very bad headache. You have problems breathing or swallowing. You have trouble opening your mouth. You have swelling in your neck or around your eye. These symptoms may represent a serious problem that is an emergency. Do not wait to see if the symptoms will go away. Get medical help right away. Call your local emergency services (911 in the [place] Do not drive yourself to the hospital. Summary A dental abscess is a collection of pus in or around a tooth that results from an infection. A dental abscess may result from severe tooth decay, trauma to the tooth, or severe gum disease around a tooth. Symptoms include severe pain, swelling, redness, and drainage of pus in and around the infected tooth. The first priority in treating a dental abscess is to drain out the pus. Treatment may also involve removing damage inside the tooth (root canal) or extracting the tooth. This information is not intended to replace advice given to you by your health care provider. Make sure you discuss any questions you have with your health care provider. Document Revised: 12/25/2020 Document Reviewed: 12/25/2020 Elsevier Patient Education  2023 [employer] Inc.  

## 2022-07-24 NOTE — Progress Notes (Signed)
Virtual Visit Consent   Christina Nelson, you are scheduled for a virtual visit with [name], FNP, a [facility] provider, today.     Just as with appointments in the office, your consent must be obtained to participate.  Your consent will be active for this visit and any virtual visit you may have with one of our providers in the next 365 days.     If you have a MyChart account, a copy of this consent can be sent to you electronically.  All virtual visits are billed to your insurance company just like a traditional visit in the office.    As this is a virtual visit, video technology does not allow for your provider to perform a traditional examination.  This may limit your provider's ability to fully assess your condition.  If your provider identifies any concerns that need to be evaluated in person or the need to arrange testing (such as labs, EKG, etc.), we will make arrangements to do so.     Although advances in technology are sophisticated, we cannot ensure that it will always work on either your end or our end.  If the connection with a video visit is poor, the visit may have to be switched to a telephone visit.  With either a video or telephone visit, we are not always able to ensure that we have a secure connection.     I need to obtain your verbal consent now.   Are you willing to proceed with your visit today? YES   Christina Nelson has provided verbal consent on 07/24/2022 for a virtual visit (video or telephone).   [name], FNP   Date: 07/24/2022 2:09 PM   Virtual Visit via Video Note   I, [name], connected with Christina Nelson ([id number], October 25, 1981) on 07/24/22 at  2:15 PM EDT by a video-enabled telemedicine application and verified that I am speaking with the correct person using two identifiers.  Location: Patient: Virtual Visit Location Patient: Home Provider: Virtual Visit Location Provider: Mobile   I discussed the limitations of  evaluation and management by telemedicine and the availability of in person appointments. The patient expressed understanding and agreed to proceed.    History of Present Illness: Christina Nelson is a 41 y.o. who identifies as a female who was assigned female at birth, and is being seen today for abscess tooth.  HPI: Patient does video with tooth abscess. She was treated Tuesday with ceftin. Has not helped her at all. Pain is unchanged and swelling is worse.    * has dental appointmnet in 2 weeks. Review of Systems  Constitutional:  Negative for chills and fever.  Cardiovascular: Negative.   Psychiatric/Behavioral: Negative.    All other systems reviewed and are negative.   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   Tobacco abuse 05/31/2017   Seizure-like activity (HCC) 05/31/2017   Anxiety with depression 05/31/2017    Allergies:  Allergies  Allergen Reactions   Amoxicillin Hives    Breaks out next day   Penicillins Hives    Has patient had a PCN reaction causing immediate rash, facial/tongue/throat swelling, SOB or lightheadedness with hypotension: NO Has patient had a PCN reaction causing severe rash involving mucus membranes or skin necrosis: No Has patient had a PCN reaction that required hospitalization No Has patient had a PCN reaction occurring within the last 10 years: No If  all of the above answers are "NO", then may proceed with Cephalosporin use. ALL ARE NO    Medications:  Current Outpatient Medications:    amLODipine (NORVASC) 5 MG tablet, Take 1 tablet (5 mg total) by mouth daily., Disp: 30 tablet, Rfl: 0   buprenorphine-naloxone (SUBOXONE) 8-2 mg SUBL SL tablet, Place 1 tablet under the tongue every 12 (twelve) hours., Disp: , Rfl:    cefUROXime (CEFTIN) 500 MG tablet, Take 1 tablet (500 mg total) by mouth 2 (two) times daily with a meal for 10 days., Disp: 20 tablet, Rfl: 0   chlorhexidine  (PERIDEX) 0.12 % solution, Use as directed 15 mLs in the mouth or throat 2 (two) times daily. (Patient taking differently: Use as directed 15 mLs in the mouth or throat 2 (two) times daily as needed (mouth pain).), Disp: 473 mL, Rfl: 0   clarithromycin (BIAXIN) 500 MG tablet, Take 500 mg by mouth 2 (two) times daily., Disp: , Rfl:    cloNIDine (CATAPRES) 0.1 MG tablet, Take 1 tablet (0.1 mg total) by mouth 2 (two) times daily., Disp: 30 tablet, Rfl: 0   dicyclomine (BENTYL) 20 MG tablet, Take 1 tablet (20 mg total) by mouth 2 (two) times daily., Disp: 20 tablet, Rfl: 0   famotidine (PEPCID) 40 MG tablet, Take 0.5 tablets (20 mg total) by mouth 2 (two) times daily., Disp: 30 tablet, Rfl: 0   gabapentin (NEURONTIN) 300 MG capsule, Take 1 capsule (300 mg total) by mouth 3 (three) times daily., Disp: 90 capsule, Rfl: 0   ibuprofen (ADVIL,MOTRIN) 200 MG tablet, Take 200 mg by mouth every 6 (six) hours as needed for mild pain or moderate pain., Disp: , Rfl:    levETIRAcetam (KEPPRA) 500 MG tablet, Take 1 tablet three times a day (Patient taking differently: Take 500 mg by mouth in the morning, at noon, and at bedtime.), Disp: 90 tablet, Rfl: 11   lidocaine (XYLOCAINE) 2 % solution, Use as directed 15 mLs in the mouth or throat as needed for mouth pain., Disp: 100 mL, Rfl: 0  Observations/Objective: Patient is well-developed, well-nourished in no acute distress.  Resting comfortably  at home.  Head is normocephalic atraumatic.  No labored breathing.  Speech is clear and coherent with logical content.  Patient is alert and oriented at baseline.  Left side of jaw swollen  Assessment and Plan:  [name] in today with chief complaint of abscess tooth   1. Tooth abscess Gargle with warm salt water Keep appointment with dentist Stop ceftin Continue motrin for pain Meds ordered this encounter  Medications   clindamycin (CLEOCIN) 300 MG capsule    Sig: Take 1 capsule (300 mg total) by mouth 4  (four) times daily.    Dispense:  40 capsule    Refill:  0    Order Specific Question:   Supervising Provider    Answer:   Chase Picket [id number]       Follow Up Instructions: I discussed the assessment and treatment plan with the patient. The patient was provided an opportunity to ask questions and all were answered. The patient agreed with the plan and demonstrated an understanding of the instructions.  A copy of instructions were sent to the patient via MyChart.  The patient was advised to call back or seek an in-person evaluation if the symptoms worsen or if the condition fails to improve as anticipated.  Time:  I spent 8 minutes with the patient via telehealth technology discussing the above problems/concerns.  Mary-Margaret Hassell Done, FNP

## 2022-07-26 NOTE — Progress Notes (Signed)
Erroneous

## 2022-07-27 ENCOUNTER — Ambulatory Visit: Payer: Medicaid Other | Admitting: Gastroenterology

## 2022-08-04 ENCOUNTER — Encounter: Payer: Self-pay | Admitting: Nurse Practitioner

## 2022-08-07 ENCOUNTER — Telehealth: Payer: Medicaid Other | Admitting: Physician Assistant

## 2022-08-07 DIAGNOSIS — G8929 Other chronic pain: Secondary | ICD-10-CM

## 2022-08-07 DIAGNOSIS — K047 Periapical abscess without sinus: Secondary | ICD-10-CM

## 2022-08-07 NOTE — Progress Notes (Signed)
Virtual Visit Consent   [name], you are scheduled for a virtual visit with a [facility] provider today. Just as with appointments in the office, your consent must be obtained to participate. Your consent will be active for this visit and any virtual visit you may have with one of our providers in the next 365 days. If you have a MyChart account, a copy of this consent can be sent to you electronically.  As this is a virtual visit, video technology does not allow for your provider to perform a traditional examination. This may limit your provider's ability to fully assess your condition. If your provider identifies any concerns that need to be evaluated in person or the need to arrange testing (such as labs, EKG, etc.), we will make arrangements to do so. Although advances in technology are sophisticated, we cannot ensure that it will always work on either your end or our end. If the connection with a video visit is poor, the visit may have to be switched to a telephone visit. With either a video or telephone visit, we are not always able to ensure that we have a secure connection.  By engaging in this virtual visit, you consent to the provision of healthcare and authorize for your insurance to be billed (if applicable) for the services provided during this visit. Depending on your insurance coverage, you may receive a charge related to this service.  I need to obtain your verbal consent now. Are you willing to proceed with your visit today? Christina Nelson has provided verbal consent on 08/07/2022 for a virtual visit (video or telephone). Christina Nelson, [place]  Date: 08/07/2022 1:51 PM  Virtual Visit via Video Note   I, Christina Nelson, connected with  Christina Nelson  ([id number], 06/13/1981) on 08/07/22 at  1:45 PM EDT by a video-enabled telemedicine application and verified that I am speaking with the correct person using two identifiers.  Location: Patient: Virtual Visit Location  Patient: Home Provider: Virtual Visit Location Provider: Home Office   I discussed the limitations of evaluation and management by telemedicine and the availability of in person appointments. The patient expressed understanding and agreed to proceed.    History of Present Illness: AKILA Nelson is a 41 y.o. who identifies as a female who was assigned female at birth, and is being seen today for continued and progressing dental pain and infection despite multiple rounds of antibiotics. Pain is now stabbing and severe despite tylenol, ibuprofen. Has been applying Orajel and Goody Powders topically to causative tooth. Has appt with dentist on Monday but unsure if she can wait that long to get relief.    HPI: HPI  Problems:  Patient Active Problem List   Diagnosis Date Noted   Substance abuse in remission ([place]) 05/31/2017   Tobacco abuse 05/31/2017   Seizure-like activity ([place]) 05/31/2017   Anxiety with depression 05/31/2017    Allergies:  Allergies  Allergen Reactions   Amoxicillin Hives    Breaks out next day   Penicillins Hives    Has patient had a PCN reaction causing immediate rash, facial/tongue/throat swelling, SOB or lightheadedness with hypotension: NO Has patient had a PCN reaction causing severe rash involving mucus membranes or skin necrosis: No Has patient had a PCN reaction that required hospitalization No Has patient had a PCN reaction occurring within the last 10 years: No If all of the above answers are "NO", then may proceed with Cephalosporin use. ALL ARE NO  Medications:  Current Outpatient Medications:    amLODipine (NORVASC) 5 MG tablet, Take 1 tablet (5 mg total) by mouth daily., Disp: 30 tablet, Rfl: 0   buprenorphine-naloxone (SUBOXONE) 8-2 mg SUBL SL tablet, Place 1 tablet under the tongue every 12 (twelve) hours., Disp: , Rfl:    chlorhexidine (PERIDEX) 0.12 % solution, Use as directed 15 mLs in the mouth or throat 2 (two) times daily. (Patient taking  differently: Use as directed 15 mLs in the mouth or throat 2 (two) times daily as needed (mouth pain).), Disp: 473 mL, Rfl: 0   clarithromycin (BIAXIN) 500 MG tablet, Take 500 mg by mouth 2 (two) times daily., Disp: , Rfl:    clindamycin (CLEOCIN) 300 MG capsule, Take 1 capsule (300 mg total) by mouth 4 (four) times daily., Disp: 40 capsule, Rfl: 0   cloNIDine (CATAPRES) 0.1 MG tablet, Take 1 tablet (0.1 mg total) by mouth 2 (two) times daily., Disp: 30 tablet, Rfl: 0   dicyclomine (BENTYL) 20 MG tablet, Take 1 tablet (20 mg total) by mouth 2 (two) times daily., Disp: 20 tablet, Rfl: 0   famotidine (PEPCID) 40 MG tablet, Take 0.5 tablets (20 mg total) by mouth 2 (two) times daily., Disp: 30 tablet, Rfl: 0   gabapentin (NEURONTIN) 300 MG capsule, Take 1 capsule (300 mg total) by mouth 3 (three) times daily., Disp: 90 capsule, Rfl: 0   ibuprofen (ADVIL,MOTRIN) 200 MG tablet, Take 200 mg by mouth every 6 (six) hours as needed for mild pain or moderate pain., Disp: , Rfl:    levETIRAcetam (KEPPRA) 500 MG tablet, Take 1 tablet three times a day (Patient taking differently: Take 500 mg by mouth in the morning, at noon, and at bedtime.), Disp: 90 tablet, Rfl: 11   lidocaine (XYLOCAINE) 2 % solution, Use as directed 15 mLs in the mouth or throat as needed for mouth pain., Disp: 100 mL, Rfl: 0  Observations/Objective: Patient is well-developed, well-nourished in no acute distress.  Resting comfortably at home.  Head is normocephalic, atraumatic.  No labored breathing. Speech is clear and coherent with logical content.  Patient is alert and oriented at baseline.  Assessment and Plan: 1. Chronic dental infection  2. Chronic dental pain  Sent for in-person evaluation at Urgent Care or ER giving ongoing pain and infection despite prior treatments.   Follow Up Instructions: I discussed the assessment and treatment plan with the patient. The patient was provided an opportunity to ask questions and all  were answered. The patient agreed with the plan and demonstrated an understanding of the instructions.  A copy of instructions were sent to the patient via MyChart unless otherwise noted below.   The patient was advised to call back or seek an in-person evaluation if the symptoms worsen or if the condition fails to improve as anticipated.  Time:  I spent 8 minutes with the patient via telehealth technology discussing the above problems/concerns.    [name], PA-C

## 2022-08-07 NOTE — Patient Instructions (Signed)
Christina Nelson, thank you for joining [name], PA-C for today's virtual visit.  While this provider is not your primary care provider (PCP), if your PCP is located in our provider database this encounter information will be shared with them immediately following your visit.  Consent: (Patient) Christina Nelson provided verbal consent for this virtual visit at the beginning of the encounter.  Current Medications:  Current Outpatient Medications:    amLODipine (NORVASC) 5 MG tablet, Take 1 tablet (5 mg total) by mouth daily., Disp: 30 tablet, Rfl: 0   buprenorphine-naloxone (SUBOXONE) 8-2 mg SUBL SL tablet, Place 1 tablet under the tongue every 12 (twelve) hours., Disp: , Rfl:    chlorhexidine (PERIDEX) 0.12 % solution, Use as directed 15 mLs in the mouth or throat 2 (two) times daily. (Patient taking differently: Use as directed 15 mLs in the mouth or throat 2 (two) times daily as needed (mouth pain).), Disp: 473 mL, Rfl: 0   clarithromycin (BIAXIN) 500 MG tablet, Take 500 mg by mouth 2 (two) times daily., Disp: , Rfl:    clindamycin (CLEOCIN) 300 MG capsule, Take 1 capsule (300 mg total) by mouth 4 (four) times daily., Disp: 40 capsule, Rfl: 0   cloNIDine (CATAPRES) 0.1 MG tablet, Take 1 tablet (0.1 mg total) by mouth 2 (two) times daily., Disp: 30 tablet, Rfl: 0   dicyclomine (BENTYL) 20 MG tablet, Take 1 tablet (20 mg total) by mouth 2 (two) times daily., Disp: 20 tablet, Rfl: 0   famotidine (PEPCID) 40 MG tablet, Take 0.5 tablets (20 mg total) by mouth 2 (two) times daily., Disp: 30 tablet, Rfl: 0   gabapentin (NEURONTIN) 300 MG capsule, Take 1 capsule (300 mg total) by mouth 3 (three) times daily., Disp: 90 capsule, Rfl: 0   ibuprofen (ADVIL,MOTRIN) 200 MG tablet, Take 200 mg by mouth every 6 (six) hours as needed for mild pain or moderate pain., Disp: , Rfl:    levETIRAcetam (KEPPRA) 500 MG tablet, Take 1 tablet three times a day (Patient taking differently: Take 500 mg by mouth in  the morning, at noon, and at bedtime.), Disp: 90 tablet, Rfl: 11   lidocaine (XYLOCAINE) 2 % solution, Use as directed 15 mLs in the mouth or throat as needed for mouth pain., Disp: 100 mL, Rfl: 0   Medications ordered in this encounter:  No orders of the defined types were placed in this encounter.    *If you need refills on other medications prior to your next appointment, please contact your pharmacy*  Follow-Up: Call back or seek an in-person evaluation if the symptoms worsen or if the condition fails to improve as anticipated.  [facility] [phone]  Other Instructions Please use link below to go for evaluation. If anything worsening further, please go to closest ER facility.   If you have been instructed to have an in-person evaluation today at a local Urgent Care facility, please use the link below. It will take you to a list of all of our available [facility], including address, phone number and hours of operation. Please do not delay care.  [facility]  If you or a family member do not have a primary care provider, use the link below to schedule a visit and establish care. When you choose a [facility] primary care physician or advanced practice provider, you gain a long-term partner in health. Find a Primary Care Provider  Learn more about 's in-office and  virtual care options: [facility]

## 2022-08-08 ENCOUNTER — Ambulatory Visit: Payer: Medicaid Other

## 2022-08-09 NOTE — Progress Notes (Deleted)
Referring Provider: [facility] Primary Care Physician:  Pcp, No Primary Gastroenterologist:  Dr. Marland Kitchen  No chief complaint on file.   HPI:   Christina Nelson is a 41 y.o. female presenting today at the request of [facility] emergency department for epigastric abdominal pain.  Patient was seen 06/27/2022 in the emergency room with complaints of abdominal pain/thoracic back pain x1 week.  Symptoms worsened postprandially with associated nausea without vomiting.  Also reported baseline intermittent constipation.  Using stool softeners, Dulcolax, Pepto-Bismol for symptoms.  Also taking ibuprofen 4-5 times daily for the past 2 to 3 months secondary to dental pain.  She is found to have no significant abnormalities on CBC, CMP, lipase.  UA with trace leukocytes, few bacteria.  CT A/P with contrast with no acute findings.  She was given famotidine, Maalox, dicyclomine, Zofran, IV fluids with clinical improvement.  She was discharged with Bentyl, Pepcid, clindamycin, and advised to follow-up with PCP and GI.  Today:    Past Medical History:  Diagnosis Date   Allergy    Anxiety    Back pain    Depression    Headache(784.0)    Migraine    Seizures (HCC)    Substance abuse (HCC)     Past Surgical History:  Procedure Laterality Date   CESAREAN SECTION     DILATION AND CURETTAGE OF UTERUS      Current Outpatient Medications  Medication Sig Dispense Refill   amLODipine (NORVASC) 5 MG tablet Take 1 tablet (5 mg total) by mouth daily. 30 tablet 0   buprenorphine-naloxone (SUBOXONE) 8-2 mg SUBL SL tablet Place 1 tablet under the tongue every 12 (twelve) hours.     chlorhexidine (PERIDEX) 0.12 % solution Use as directed 15 mLs in the mouth or throat 2 (two) times daily. (Patient taking differently: Use as directed 15 mLs in the mouth or throat 2 (two) times daily as needed (mouth pain).) 473 mL 0   clarithromycin (BIAXIN) 500 MG tablet Take 500 mg by mouth 2 (two) times daily.     clindamycin  (CLEOCIN) 300 MG capsule Take 1 capsule (300 mg total) by mouth 4 (four) times daily. 40 capsule 0   cloNIDine (CATAPRES) 0.1 MG tablet Take 1 tablet (0.1 mg total) by mouth 2 (two) times daily. 30 tablet 0   dicyclomine (BENTYL) 20 MG tablet Take 1 tablet (20 mg total) by mouth 2 (two) times daily. 20 tablet 0   famotidine (PEPCID) 40 MG tablet Take 0.5 tablets (20 mg total) by mouth 2 (two) times daily. 30 tablet 0   gabapentin (NEURONTIN) 300 MG capsule Take 1 capsule (300 mg total) by mouth 3 (three) times daily. 90 capsule 0   ibuprofen (ADVIL,MOTRIN) 200 MG tablet Take 200 mg by mouth every 6 (six) hours as needed for mild pain or moderate pain.     levETIRAcetam (KEPPRA) 500 MG tablet Take 1 tablet three times a day (Patient taking differently: Take 500 mg by mouth in the morning, at noon, and at bedtime.) 90 tablet 11   lidocaine (XYLOCAINE) 2 % solution Use as directed 15 mLs in the mouth or throat as needed for mouth pain. 100 mL 0   No current facility-administered medications for this visit.    Allergies as of 08/11/2022 - Review Complete 08/07/2022  Allergen Reaction Noted   Amoxicillin Hives 08/09/2012   Penicillins Hives 08/09/2012    Family History  Problem Relation Age of Onset   Diabetes Mother  Hypertension Mother    Hyperlipidemia Mother    Depression Mother    Cancer Other    Asthma Son    Asthma Son    Birth defects Son     Social History   Socioeconomic History   Marital status: Single    Spouse name: Thurmond Butts   Number of children: 2   Years of education: 8   Highest education level: Not on file  Occupational History   Occupation: unemployed  Tobacco Use   Smoking status: Every Day    Packs/day: 1.00    Years: 10.00    Total pack years: 10.00    Types: Cigarettes   Smokeless tobacco: Never  Vaping Use   Vaping Use: Some days  Substance and Sexual Activity   Alcohol use: Not Currently   Drug use: Yes    Types: Marijuana    Comment: vape    Sexual activity: Yes    Birth control/protection: Condom  Other Topics Concern   Not on file  Social History Narrative   Lives in apartment   Lives with 2 sons   Ryan stays there most of the time   He is recovered addict   Spends leisure time with children, sleeps, watches TV   Social Determinants of Health   Financial Resource Strain: Not on file  Food Insecurity: Not on file  Transportation Needs: Not on file  Physical Activity: Not on file  Stress: Not on file  Social Connections: Not on file  Intimate Partner Violence: Not on file    Review of Systems: Gen: Denies any fever, chills, cold or flu like symptoms, pre-syncope, or syncope.  CV: Denies chest pain, heart palpitations.  Resp: Denies shortness of breath, cough.  GI: See HPI GU : Denies urinary burning, urinary frequency, urinary hesitancy MS: Denies joint pain. Derm: Denies rash. Psych: Denies depression, anxiety. Heme: See HPI  Physical Exam: There were no vitals taken for this visit. General:   Alert and oriented. Pleasant and cooperative. Well-nourished and well-developed.  Head:  Normocephalic and atraumatic. Eyes:  Without icterus, sclera clear and conjunctiva pink.  Ears:  Normal auditory acuity. Lungs:  Clear to auscultation bilaterally. No wheezes, rales, or rhonchi. No distress.  Heart:  S1, S2 present without murmurs appreciated.  Abdomen:  +BS, soft, non-tender and non-distended. No HSM noted. No guarding or rebound. No masses appreciated.  Rectal:  Deferred  Msk:  Symmetrical without gross deformities. Normal posture. Extremities:  Without edema. Neurologic:  Alert and  oriented x4;  grossly normal neurologically. Skin:  Intact without significant lesions or rashes. Psych: Normal mood and affect.    Assessment:     Plan:  ***   Aliene Altes, PA-C [facility] 08/11/2022

## 2022-08-10 ENCOUNTER — Other Ambulatory Visit: Payer: Self-pay

## 2022-08-10 ENCOUNTER — Encounter (HOSPITAL_COMMUNITY): Payer: Self-pay | Admitting: Emergency Medicine

## 2022-08-10 ENCOUNTER — Emergency Department (HOSPITAL_COMMUNITY)
Admission: EM | Admit: 2022-08-10 | Discharge: 2022-08-10 | Disposition: A | Discharge status: Home / Self Care | Payer: Medicaid Other | Attending: Emergency Medicine | Admitting: Emergency Medicine

## 2022-08-10 DIAGNOSIS — K047 Periapical abscess without sinus: Secondary | ICD-10-CM | POA: Diagnosis not present

## 2022-08-10 DIAGNOSIS — Z79899 Other long term (current) drug therapy: Secondary | ICD-10-CM | POA: Diagnosis not present

## 2022-08-10 DIAGNOSIS — K0889 Other specified disorders of teeth and supporting structures: Secondary | ICD-10-CM | POA: Diagnosis present

## 2022-08-10 MED ORDER — CEFDINIR 300 MG PO CAPS: 300.0000 mg | ORAL_CAPSULE | Freq: Two times a day (BID) | ORAL | 0 refills | Status: DC

## 2022-08-10 MED ORDER — METRONIDAZOLE 500 MG PO TABS: 500.0000 mg | ORAL_TABLET | Freq: Two times a day (BID) | ORAL | 0 refills | Status: DC

## 2022-08-10 NOTE — ED Provider Notes (Signed)
[facility] EMERGENCY DEPARTMENT Provider Note   CSN: [id number] Arrival date & time: 08/10/22  4627     History  Chief Complaint  Patient presents with   Dental Pain    Christina Nelson is a 41 y.o. female.  Patient presents to the emergency department for ongoing left lower jaw swelling.  Patient has a history of dental abscess.  States that she has been on several antibiotic courses including clindamycin, finished about a week ago.  Patient states that she has seen a dentist about this and was told to go back to urgent care or ED to have the abscess drained.  This visit was about a week and a half ago.  No fevers, difficulty breathing or swallowing.  He has been taking over-the-counter medications for symptoms.  Last course of clindamycin was moderately effective, but swelling persists.       Home Medications Prior to Admission medications   Medication Sig Start Date End Date Taking? Authorizing Provider  cefdinir (OMNICEF) 300 MG capsule Take 1 capsule (300 mg total) by mouth 2 (two) times daily. 08/10/22  Yes Renne Crigler, PA-C  metroNIDAZOLE (FLAGYL) 500 MG tablet Take 1 tablet (500 mg total) by mouth 2 (two) times daily. 08/10/22  Yes Renne Crigler, PA-C  amLODipine (NORVASC) 5 MG tablet Take 1 tablet (5 mg total) by mouth daily. 07/08/22   Honor Loh M, PA-C  buprenorphine-naloxone (SUBOXONE) 8-2 mg SUBL SL tablet Place 1 tablet under the tongue every 12 (twelve) hours.    [provider]  chlorhexidine (PERIDEX) 0.12 % solution Use as directed 15 mLs in the mouth or throat 2 (two) times daily. Patient taking differently: Use as directed 15 mLs in the mouth or throat 2 (two) times daily as needed (mouth pain). 04/04/22   Claiborne Rigg, NP  cloNIDine (CATAPRES) 0.1 MG tablet Take 1 tablet (0.1 mg total) by mouth 2 (two) times daily. 07/08/22   Honor Loh M, PA-C  dicyclomine (BENTYL) 20 MG tablet Take 1 tablet (20 mg total) by mouth 2 (two) times daily.  06/27/22   Peter Garter, PA  famotidine (PEPCID) 40 MG tablet Take 0.5 tablets (20 mg total) by mouth 2 (two) times daily. 06/27/22   Peter Garter, PA  gabapentin (NEURONTIN) 300 MG capsule Take 1 capsule (300 mg total) by mouth 3 (three) times daily. 07/08/22   Honor Loh M, PA-C  ibuprofen (ADVIL,MOTRIN) 200 MG tablet Take 200 mg by mouth every 6 (six) hours as needed for mild pain or moderate pain.    [provider]  levETIRAcetam (KEPPRA) 500 MG tablet Take 1 tablet three times a day Patient taking differently: Take 500 mg by mouth in the morning, at noon, and at bedtime. 12/10/19   Van Clines, MD  lidocaine (XYLOCAINE) 2 % solution Use as directed 15 mLs in the mouth or throat as needed for mouth pain. 07/08/22   Teressa Lower, PA-C      Allergies    Amoxicillin and Penicillins    Review of Systems   Review of Systems  Physical Exam Updated Vital Signs BP (!) 147/105 (BP Location: Right Arm)   Pulse 97   Temp 98.8 F (37.1 C) (Oral)   Resp 18   LMP 08/05/2022 (Exact Date)   SpO2 96%  Physical Exam Vitals and nursing note reviewed.  Constitutional:      Appearance: She is well-developed.  HENT:     Head: Normocephalic and atraumatic.  Jaw: No trismus.     Right Ear: Tympanic membrane, ear canal and external ear normal.     Left Ear: Tympanic membrane, ear canal and external ear normal.     Nose: Nose normal.     Mouth/Throat:     Dentition: Abnormal dentition. Dental caries present. No dental abscesses.     Pharynx: Uvula midline. No uvula swelling.     Tonsils: No tonsillar abscesses.     Comments: Dentition in poor repair.  There is generalized soft tissue swelling without palpable abscess in the left lower jaw.  No signs of Ludwig's angina. Eyes:     Conjunctiva/sclera: Conjunctivae normal.  Neck:     Comments: No neck swelling or Ludwig's angina Musculoskeletal:     Cervical back: Normal range of motion and neck supple.   Lymphadenopathy:     Cervical: No cervical adenopathy.  Skin:    General: Skin is warm and dry.  Neurological:     Mental Status: She is alert.     ED Results / Procedures / Treatments   Labs (all labs ordered are listed, but only abnormal results are displayed) Labs Reviewed - No data to display  EKG None  Radiology No results found.  Procedures Procedures    Medications Ordered in ED Medications - No data to display  ED Course/ Medical Decision Making/ A&P    Patient seen and examined. History obtained directly from patient.   Labs/EKG: None ordered  Imaging: None ordered  Medications/Fluids: None ordered  Most recent vital signs reviewed and are as follows: BP (!) 147/105 (BP Location: Right Arm)   Pulse 97   Temp 98.8 F (37.1 C) (Oral)   Resp 18   LMP 08/05/2022 (Exact Date)   SpO2 96%   Initial impression: Recurrent dental infection  Discussed with patient that at this time I do not feel comfortable attempting drainage as I do not visualize or palpate any definitive abscess.  Plan: Discharge to home.   Prescriptions written for: Will trial cefdinir and metronidazole.  Patient without anaphylactic reaction to penicillins.  Other home care instructions discussed: Continue antiseptic rinse   ED return instructions discussed: Worsening she will swelling, fever, inability to swallow  Follow-up instructions discussed: Patient encouraged to follow-up with dental referral when able.                           Medical Decision Making Risk Prescription drug management.   Patient presents for dental pain. They do not have a fever and do not appear septic. Exam unconcerning for Ludwig's angina or other deep tissue infection in neck and I do not feel that advanced imaging is indicated at this time. Low suspicion for PTA, RPA, epiglottis based on exam.   Patient will be treated for dental infection with antibiotic. Encouraged tylenol/NSAIDs as prescribed  or as directed on the packaging for pain. Encouraged follow-up with a dentist for definitive and long-term management.          Final Clinical Impression(s) / ED Diagnoses Final diagnoses:  Dental infection    Rx / DC Orders ED Discharge Orders          Ordered    cefdinir (OMNICEF) 300 MG capsule  2 times daily        08/10/22 1053    metroNIDAZOLE (FLAGYL) 500 MG tablet  2 times daily        08/10/22 1053  Renne Crigler, PA-C 08/10/22 1104    Loetta Rough, MD 08/10/22 2041

## 2022-08-10 NOTE — Discharge Instructions (Addendum)
Please read and follow all provided instructions.  Your diagnoses today include:  1. Dental infection    The exam and treatment you received today has been provided on an emergency basis only. This is not a substitute for complete medical or dental care.  Tests performed today include: Vital signs. See below for your results today.   Medications prescribed:  Cefdinir: Antibiotic for dental infection  Metronidazole - antibiotic  You have been prescribed an antibiotic medicine: take the entire course of medicine even if you are feeling better. Stopping early can cause the antibiotic not to work. Do not drink alcohol when taking this medication.   Take any prescribed medications only as directed.  Home care instructions:  Follow any educational materials contained in this packet.  Follow-up instructions: Please follow-up with your dentist for further evaluation of your symptoms.   Dental Assistance: See attached dental referral and/or resource guide.   Return instructions:  Please return to the Emergency Department if you experience worsening symptoms. Please return if you develop a fever, you develop more swelling in your face or neck, you have trouble breathing or swallowing food. Please return if you have any other emergent concerns.  Additional Information:  Your vital signs today were: BP (!) 147/105 (BP Location: Right Arm)   Pulse 97   Temp 98.8 F (37.1 C) (Oral)   Resp 18   LMP 08/05/2022 (Exact Date)   SpO2 96%  If your blood pressure (BP) was elevated above 135/85 this visit, please have this repeated by your doctor within one month. --------------

## 2022-08-10 NOTE — ED Triage Notes (Signed)
Pt c/o dental pain to lower right and left x 2 months. Has been to dentist who told her to come back to ED for drainage. Pt just finished clindamycin. Nad. No swelling noted.

## 2022-08-11 ENCOUNTER — Ambulatory Visit: Payer: Medicaid Other | Admitting: Gastroenterology

## 2022-08-27 ENCOUNTER — Telehealth: Payer: Medicaid Other

## 2022-08-27 ENCOUNTER — Telehealth: Payer: Medicaid Other | Admitting: Physician Assistant

## 2022-08-27 DIAGNOSIS — R569 Unspecified convulsions: Secondary | ICD-10-CM | POA: Diagnosis not present

## 2022-08-27 MED ORDER — LEVETIRACETAM 500 MG PO TABS: ORAL_TABLET | ORAL | 1 refills | Status: DC

## 2022-08-27 NOTE — Patient Instructions (Signed)
Christina Nelson, thank you for joining [name], PA-C for today's virtual visit.  While this provider is not your primary care provider (PCP), if your PCP is located in our provider database this encounter information will be shared with them immediately following your visit.   [facility] account gives you access to today's visit and all your visits, tests, and labs performed at [facility] " click here if you don't have a [facility] account or go to [email or web address]  Consent: (Patient) Christina Nelson provided verbal consent for this virtual visit at the beginning of the encounter.  Current Medications:  Current Outpatient Medications:    amLODipine (NORVASC) 5 MG tablet, Take 1 tablet (5 mg total) by mouth daily., Disp: 30 tablet, Rfl: 0   buprenorphine-naloxone (SUBOXONE) 8-2 mg SUBL SL tablet, Place 1 tablet under the tongue every 12 (twelve) hours., Disp: , Rfl:    cefdinir (OMNICEF) 300 MG capsule, Take 1 capsule (300 mg total) by mouth 2 (two) times daily., Disp: 14 capsule, Rfl: 0   chlorhexidine (PERIDEX) 0.12 % solution, Use as directed 15 mLs in the mouth or throat 2 (two) times daily. (Patient taking differently: Use as directed 15 mLs in the mouth or throat 2 (two) times daily as needed (mouth pain).), Disp: 473 mL, Rfl: 0   cloNIDine (CATAPRES) 0.1 MG tablet, Take 1 tablet (0.1 mg total) by mouth 2 (two) times daily., Disp: 30 tablet, Rfl: 0   dicyclomine (BENTYL) 20 MG tablet, Take 1 tablet (20 mg total) by mouth 2 (two) times daily., Disp: 20 tablet, Rfl: 0   famotidine (PEPCID) 40 MG tablet, Take 0.5 tablets (20 mg total) by mouth 2 (two) times daily., Disp: 30 tablet, Rfl: 0   gabapentin (NEURONTIN) 300 MG capsule, Take 1 capsule (300 mg total) by mouth 3 (three) times daily., Disp: 90 capsule, Rfl: 0   ibuprofen (ADVIL,MOTRIN) 200 MG tablet, Take 200 mg by mouth every 6 (six) hours as needed for mild pain or moderate pain.,  Disp: , Rfl:    levETIRAcetam (KEPPRA) 500 MG tablet, Take 1 tablet three times a day (Patient taking differently: Take 500 mg by mouth in the morning, at noon, and at bedtime.), Disp: 90 tablet, Rfl: 11   lidocaine (XYLOCAINE) 2 % solution, Use as directed 15 mLs in the mouth or throat as needed for mouth pain., Disp: 100 mL, Rfl: 0   metroNIDAZOLE (FLAGYL) 500 MG tablet, Take 1 tablet (500 mg total) by mouth 2 (two) times daily., Disp: 14 tablet, Rfl: 0   Medications ordered in this encounter:  No orders of the defined types were placed in this encounter.    *If you need refills on other medications prior to your next appointment, please contact your pharmacy*  Follow-Up: Call back or seek an in-person evaluation if the symptoms worsen or if the condition fails to improve as anticipated.  [facility] [phone]  Other Instructions I  have sent a refill to the pharmacy. Make sure to contact your Neurologist's office today to set up follow-up visit.    If you have been instructed to have an in-person evaluation today at a local Urgent Care facility, please use the link below. It will take you to a list of all of our available [facility], including address, phone number and hours of operation. Please do not delay care.  [facility]  If you or a family member do not  have a primary care provider, use the link below to schedule a visit and establish care. When you choose a [facility] primary care physician or advanced practice provider, you gain a long-term partner in health. Find a Primary Care Provider  Learn more about [facility]'s in-office and virtual care options: [facility]

## 2022-08-27 NOTE — Progress Notes (Signed)
Virtual Visit Consent   Christina Nelson, you are scheduled for a virtual visit with a [facility] provider today. Just as with appointments in the office, your consent must be obtained to participate. Your consent will be active for this visit and any virtual visit you may have with one of our providers in the next 365 days. If you have a MyChart account, a copy of this consent can be sent to you electronically.  As this is a virtual visit, video technology does not allow for your provider to perform a traditional examination. This may limit your provider's ability to fully assess your condition. If your provider identifies any concerns that need to be evaluated in person or the need to arrange testing (such as labs, EKG, etc.), we will make arrangements to do so. Although advances in technology are sophisticated, we cannot ensure that it will always work on either your end or our end. If the connection with a video visit is poor, the visit may have to be switched to a telephone visit. With either a video or telephone visit, we are not always able to ensure that we have a secure connection.  By engaging in this virtual visit, you consent to the provision of healthcare and authorize for your insurance to be billed (if applicable) for the services provided during this visit. Depending on your insurance coverage, you may receive a charge related to this service.  I need to obtain your verbal consent now. Are you willing to proceed with your visit today? Christina Nelson has provided verbal consent on 08/27/2022 for a virtual visit (video or telephone). [name], [place]  Date: 08/27/2022 8:58 AM  Virtual Visit via Video Note   I, [name], connected with  Christina Nelson  ([id number], [birth year]) on 08/27/22 at  9:00 AM EDT by a video-enabled telemedicine application and verified that I am speaking with the correct person using two identifiers.  Location: Patient: Virtual Visit Location  Patient: Home Provider: Virtual Visit Location Provider: Home Office   I discussed the limitations of evaluation and management by telemedicine and the availability of in person appointments. The patient expressed understanding and agreed to proceed.    History of Present Illness: Christina Nelson is a 41 y.o. who identifies as a female who was assigned female at birth, and is being seen today for medication refill. She is in need of a refill of her Keppra 500 mg (taken TID) for history of seizures. Is followed by Neurology (Dr. Karel Jarvis) and notes she recently had to miss an appointment due to a child's health issue. She needs to reschedule but is out of her Keppra as of last night and cannot get further refills without follow-up since last visit what some time ago. Denies any seizures for the past 2.5 years while on medication. Is very nervous to be without it.  Marland Kitchen   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   Tobacco abuse 05/31/2017   Seizure-like activity (HCC) 05/31/2017   Anxiety with depression 05/31/2017    Allergies:  Allergies  Allergen Reactions   Amoxicillin Hives    Breaks out next day   Penicillins Hives    Has patient had a PCN reaction causing immediate rash, facial/tongue/throat swelling, SOB or lightheadedness with hypotension: NO Has patient had a PCN reaction causing severe rash involving mucus membranes or skin necrosis: No Has patient  had a PCN reaction that required hospitalization No Has patient had a PCN reaction occurring within the last 10 years: No If all of the above answers are "NO", then may proceed with Cephalosporin use. ALL ARE NO    Medications:  Current Outpatient Medications:    amLODipine (NORVASC) 5 MG tablet, Take 1 tablet (5 mg total) by mouth daily., Disp: 30 tablet, Rfl: 0   buprenorphine-naloxone (SUBOXONE) 8-2 mg SUBL SL tablet, Place 1 tablet under the tongue every  12 (twelve) hours., Disp: , Rfl:    chlorhexidine (PERIDEX) 0.12 % solution, Use as directed 15 mLs in the mouth or throat 2 (two) times daily. (Patient taking differently: Use as directed 15 mLs in the mouth or throat 2 (two) times daily as needed (mouth pain).), Disp: 473 mL, Rfl: 0   cloNIDine (CATAPRES) 0.1 MG tablet, Take 1 tablet (0.1 mg total) by mouth 2 (two) times daily., Disp: 30 tablet, Rfl: 0   dicyclomine (BENTYL) 20 MG tablet, Take 1 tablet (20 mg total) by mouth 2 (two) times daily., Disp: 20 tablet, Rfl: 0   famotidine (PEPCID) 40 MG tablet, Take 0.5 tablets (20 mg total) by mouth 2 (two) times daily., Disp: 30 tablet, Rfl: 0   gabapentin (NEURONTIN) 300 MG capsule, Take 1 capsule (300 mg total) by mouth 3 (three) times daily., Disp: 90 capsule, Rfl: 0   ibuprofen (ADVIL,MOTRIN) 200 MG tablet, Take 200 mg by mouth every 6 (six) hours as needed for mild pain or moderate pain., Disp: , Rfl:    levETIRAcetam (KEPPRA) 500 MG tablet, Take 1 tablet three times a day, Disp: 90 tablet, Rfl: 1   lidocaine (XYLOCAINE) 2 % solution, Use as directed 15 mLs in the mouth or throat as needed for mouth pain., Disp: 100 mL, Rfl: 0  Observations/Objective: Patient is well-developed, well-nourished in no acute distress.  Resting comfortably at home.  Head is normocephalic, atraumatic.  No labored breathing. Speech is clear and coherent with logical content.  Patient is alert and oriented at baseline.   Assessment and Plan: 1. Seizure-like activity (HCC) - levETIRAcetam (KEPPRA) 500 MG tablet; Take 1 tablet three times a day  Dispense: 90 tablet; Refill: 1  Will give a one time refill of her medication so she is not without while awaiting a follow-up appointment with Neurology. Rx sent to the pharmacy.   Follow Up Instructions: I discussed the assessment and treatment plan with the patient. The patient was provided an opportunity to ask questions and all were answered. The patient agreed with  the plan and demonstrated an understanding of the instructions.  A copy of instructions were sent to the patient via MyChart unless otherwise noted below.   The patient was advised to call back or seek an in-person evaluation if the symptoms worsen or if the condition fails to improve as anticipated.  Time:  I spent 8 minutes with the patient via telehealth technology discussing the above problems/concerns.    [name], PA-C

## 2022-09-07 ENCOUNTER — Telehealth: Payer: Self-pay | Admitting: Neurology

## 2022-09-07 ENCOUNTER — Encounter: Payer: Self-pay | Admitting: Neurology

## 2022-09-07 NOTE — Telephone Encounter (Signed)
Called patient to sch a apapt with Aquino on 09-16-22 at 11:30.   08-27-22 lmom  08-30-22 lmom  09-02-22 lmom  09-07-22 lmom

## 2022-10-16 ENCOUNTER — Telehealth: Payer: Medicaid Other | Admitting: Physician Assistant

## 2022-10-16 ENCOUNTER — Telehealth: Payer: Medicaid Other

## 2022-10-16 DIAGNOSIS — F1911 Other psychoactive substance abuse, in remission: Secondary | ICD-10-CM | POA: Diagnosis not present

## 2022-10-16 NOTE — Progress Notes (Signed)
Virtual Visit Consent   Christina Nelson, you are scheduled for a virtual visit with a [place] [facility] provider today. Just as with appointments in the office, your consent must be obtained to participate. Your consent will be active for this visit and any virtual visit you may have with one of our providers in the next 365 days. If you have a MyChart account, a copy of this consent can be sent to you electronically.  As this is a virtual visit, video technology does not allow for your provider to perform a traditional examination. This may limit your provider's ability to fully assess your condition. If your provider identifies any concerns that need to be evaluated in person or the need to arrange testing (such as labs, EKG, etc.), we will make arrangements to do so. Although advances in technology are sophisticated, we cannot ensure that it will always work on either your end or our end. If the connection with a video visit is poor, the visit may have to be switched to a telephone visit. With either a video or telephone visit, we are not always able to ensure that we have a secure connection.  By engaging in this virtual visit, you consent to the provision of healthcare and authorize for your insurance to be billed (if applicable) for the services provided during this visit. Depending on your insurance coverage, you may receive a charge related to this service.  I need to obtain your verbal consent now. Are you willing to proceed with your visit today? Christina MALIAKA BRASINGTON has provided verbal consent on 10/16/2022 for a virtual visit (video or telephone). Christina Nelson, [place]  Date: 10/16/2022 11:55 AM  Virtual Visit via Video Note   I, Christina Nelson, connected with  Christina Nelson  ([id number], [birth year]) on 10/16/22 at 12:00 PM EST by a video-enabled telemedicine application and verified that I am speaking with the correct person using two identifiers.  Location: Patient: Virtual Visit Location Patient:  Home Provider: Virtual Visit Location Provider: Home Office   I discussed the limitations of evaluation and management by telemedicine and the availability of in person appointments. The patient expressed understanding and agreed to proceed.    History of Present Illness: Christina Nelson is a 41 y.o. who identifies as a female who was assigned female at birth, and is being seen today for suboxone concerns. Patient is new to the area and needs suboxone refill. She is out as of today. She has been on suboxone for 5 years and does not have a convenient local clinic for this.  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   Tobacco abuse 05/31/2017   Seizure-like activity (HCC) 05/31/2017   Anxiety with depression 05/31/2017    Allergies:  Allergies  Allergen Reactions   Amoxicillin Hives    Breaks out next day   Penicillins Hives    Has patient had a PCN reaction causing immediate rash, facial/tongue/throat swelling, SOB or lightheadedness with hypotension: NO Has patient had a PCN reaction causing severe rash involving mucus membranes or skin necrosis: No Has patient had a PCN reaction that required hospitalization No Has patient had a PCN reaction occurring within the last 10 years: No If all of the above answers are "NO", then may proceed with Cephalosporin use. ALL ARE NO    Medications:  Current Outpatient Medications:    amLODipine (NORVASC) 5 MG tablet, Take 1 tablet (  5 mg total) by mouth daily., Disp: 30 tablet, Rfl: 0   buprenorphine-naloxone (SUBOXONE) 8-2 mg SUBL SL tablet, Place 1 tablet under the tongue every 12 (twelve) hours., Disp: , Rfl:    chlorhexidine (PERIDEX) 0.12 % solution, Use as directed 15 mLs in the mouth or throat 2 (two) times daily. (Patient taking differently: Use as directed 15 mLs in the mouth or throat 2 (two) times daily as needed (mouth pain).), Disp: 473 mL, Rfl: 0    cloNIDine (CATAPRES) 0.1 MG tablet, Take 1 tablet (0.1 mg total) by mouth 2 (two) times daily., Disp: 30 tablet, Rfl: 0   dicyclomine (BENTYL) 20 MG tablet, Take 1 tablet (20 mg total) by mouth 2 (two) times daily., Disp: 20 tablet, Rfl: 0   famotidine (PEPCID) 40 MG tablet, Take 0.5 tablets (20 mg total) by mouth 2 (two) times daily., Disp: 30 tablet, Rfl: 0   gabapentin (NEURONTIN) 300 MG capsule, Take 1 capsule (300 mg total) by mouth 3 (three) times daily., Disp: 90 capsule, Rfl: 0   ibuprofen (ADVIL,MOTRIN) 200 MG tablet, Take 200 mg by mouth every 6 (six) hours as needed for mild pain or moderate pain., Disp: , Rfl:    levETIRAcetam (KEPPRA) 500 MG tablet, Take 1 tablet three times a day, Disp: 90 tablet, Rfl: 1   lidocaine (XYLOCAINE) 2 % solution, Use as directed 15 mLs in the mouth or throat as needed for mouth pain., Disp: 100 mL, Rfl: 0  Observations/Objective: Patient is well-developed, well-nourished in no acute distress.  Resting comfortably  at home.  Head is normocephalic, atraumatic.  No labored breathing.  Speech is clear and coherent with logical content.  Patient is alert and oriented at baseline.   Assessment and Plan: 1. Substance abuse in remission [facility]) Discussed that I am unable to provide script for Suboxone Recommended that she go to ER or UC for further evaluation to see if they can assist She was in agreement  Follow Up Instructions: I discussed the assessment and treatment plan with the patient. The patient was provided an opportunity to ask questions and all were answered. The patient agreed with the plan and demonstrated an understanding of the instructions.  A copy of instructions were sent to the patient via MyChart unless otherwise noted below.    The patient was advised to call back or seek an in-person evaluation if the symptoms worsen or if the condition fails to improve as anticipated.  Time:  I spent 5-10 minutes with the patient via telehealth  technology discussing the above problems/concerns.    Christina Nelson, [place]

## 2022-11-05 ENCOUNTER — Telehealth: Payer: Medicaid Other | Admitting: Physician Assistant

## 2022-11-05 ENCOUNTER — Telehealth: Payer: Medicaid Other

## 2022-11-05 DIAGNOSIS — K047 Periapical abscess without sinus: Secondary | ICD-10-CM | POA: Diagnosis not present

## 2022-11-05 MED ORDER — CLINDAMYCIN HCL 300 MG PO CAPS: 300.0000 mg | ORAL_CAPSULE | Freq: Three times a day (TID) | ORAL | 0 refills | Status: DC

## 2022-11-05 MED ORDER — IBUPROFEN 800 MG PO TABS: 800.0000 mg | ORAL_TABLET | Freq: Three times a day (TID) | ORAL | 0 refills | Status: AC | PRN

## 2022-11-05 MED ORDER — LIDOCAINE VISCOUS HCL 2 % MT SOLN: 15.0000 mL | OROMUCOSAL | 0 refills | Status: AC | PRN

## 2022-11-05 MED ORDER — CHLORHEXIDINE GLUCONATE 0.12 % MT SOLN: 15.0000 mL | Freq: Two times a day (BID) | OROMUCOSAL | 0 refills | Status: AC

## 2022-11-05 NOTE — Progress Notes (Signed)
Virtual Visit Consent   Christina Nelson, you are scheduled for a virtual visit with a [place] provider today. Just as with appointments in the office, your consent must be obtained to participate. Your consent will be active for this visit and any virtual visit you may have with one of our providers in the next 365 days. If you have a MyChart account, a copy of this consent can be sent to you electronically.  As this is a virtual visit, video technology does not allow for your provider to perform a traditional examination. This may limit your provider's ability to fully assess your condition. If your provider identifies any concerns that need to be evaluated in person or the need to arrange testing (such as labs, EKG, etc.), we will make arrangements to do so. Although advances in technology are sophisticated, we cannot ensure that it will always work on either your end or our end. If the connection with a video visit is poor, the visit may have to be switched to a telephone visit. With either a video or telephone visit, we are not always able to ensure that we have a secure connection.  By engaging in this virtual visit, you consent to the provision of healthcare and authorize for your insurance to be billed (if applicable) for the services provided during this visit. Depending on your insurance coverage, you may receive a charge related to this service.  I need to obtain your verbal consent now. Are you willing to proceed with your visit today? Christina Nelson has provided verbal consent on 11/05/2022 for a virtual visit (video or telephone). Mar Daring, PA-C  Date: 11/05/2022 8:02 AM  Virtual Visit via Video Note   I, Mar Daring, connected with  Christina Nelson  ([id number], 1981/05/25) on 11/05/22 at  8:00 AM EST by a video-enabled telemedicine application and verified that I am speaking with the correct person using two identifiers.  Location: Patient: Virtual Visit Location  Patient: Home Provider: Virtual Visit Location Provider: Home Office   I discussed the limitations of evaluation and management by telemedicine and the availability of in person appointments. The patient expressed understanding and agreed to proceed.    History of Present Illness: Christina Nelson is a 42 y.o. who identifies as a female who was assigned female at birth, and is being seen today for a dental infection, recurrent issue. Last was in Oct 2023.  HPI: Dental Pain  This is a recurrent problem. The current episode started in the past 7 days (2 days ago). The problem occurs constantly. The problem has been gradually worsening. The pain is moderate. Associated symptoms include facial pain, sinus pressure (mildly on the right) and thermal sensitivity. Pertinent negatives include no difficulty swallowing or fever. She has tried ice (orange goody powder directly on tooth, ibuprofen) for the symptoms. The treatment provided no relief.    Lower right jaw and front left tooth   Problems:  Patient Active Problem List   Diagnosis Date Noted   Substance abuse in remission ([place]) 05/31/2017   Tobacco abuse 05/31/2017   Seizure-like activity ([facility]) 05/31/2017   Anxiety with depression 05/31/2017    Allergies:  Allergies  Allergen Reactions   Amoxicillin Hives    Breaks out next day   Penicillins Hives    Has patient had a PCN reaction causing immediate rash, facial/tongue/throat swelling, SOB or lightheadedness with hypotension: NO Has patient had a PCN reaction causing severe rash involving mucus membranes or  skin necrosis: No Has patient had a PCN reaction that required hospitalization No Has patient had a PCN reaction occurring within the last 10 years: No If all of the above answers are "NO", then may proceed with Cephalosporin use. ALL ARE NO    Medications:  Current Outpatient Medications:    chlorhexidine (PERIDEX) 0.12 % solution, Use as directed 15 mLs in the mouth or throat 2  (two) times daily., Disp: 240 mL, Rfl: 0   clindamycin (CLEOCIN) 300 MG capsule, Take 1 capsule (300 mg total) by mouth 3 (three) times daily., Disp: 30 capsule, Rfl: 0   ibuprofen (ADVIL) 800 MG tablet, Take 1 tablet (800 mg total) by mouth every 8 (eight) hours as needed., Disp: 30 tablet, Rfl: 0   lidocaine (XYLOCAINE) 2 % solution, Use as directed 15 mLs in the mouth or throat every 4 (four) hours as needed for mouth pain., Disp: 200 mL, Rfl: 0   amLODipine (NORVASC) 5 MG tablet, Take 1 tablet (5 mg total) by mouth daily., Disp: 30 tablet, Rfl: 0   buprenorphine-naloxone (SUBOXONE) 8-2 mg SUBL SL tablet, Place 1 tablet under the tongue every 12 (twelve) hours., Disp: , Rfl:    cloNIDine (CATAPRES) 0.1 MG tablet, Take 1 tablet (0.1 mg total) by mouth 2 (two) times daily., Disp: 30 tablet, Rfl: 0   dicyclomine (BENTYL) 20 MG tablet, Take 1 tablet (20 mg total) by mouth 2 (two) times daily., Disp: 20 tablet, Rfl: 0   famotidine (PEPCID) 40 MG tablet, Take 0.5 tablets (20 mg total) by mouth 2 (two) times daily., Disp: 30 tablet, Rfl: 0   gabapentin (NEURONTIN) 300 MG capsule, Take 1 capsule (300 mg total) by mouth 3 (three) times daily., Disp: 90 capsule, Rfl: 0   levETIRAcetam (KEPPRA) 500 MG tablet, Take 1 tablet three times a day, Disp: 90 tablet, Rfl: 1  Observations/Objective: Patient is well-developed, well-nourished in no acute distress.  Resting comfortably at home.  Head is normocephalic, atraumatic.  No labored breathing.  Speech is clear and coherent with logical content.  Patient is alert and oriented at baseline.    Assessment and Plan: 1. Chronic dental infection - clindamycin (CLEOCIN) 300 MG capsule; Take 1 capsule (300 mg total) by mouth 3 (three) times daily.  Dispense: 30 capsule; Refill: 0 - ibuprofen (ADVIL) 800 MG tablet; Take 1 tablet (800 mg total) by mouth every 8 (eight) hours as needed.  Dispense: 30 tablet; Refill: 0 - lidocaine (XYLOCAINE) 2 % solution; Use as  directed 15 mLs in the mouth or throat every 4 (four) hours as needed for mouth pain.  Dispense: 200 mL; Refill: 0 - chlorhexidine (PERIDEX) 0.12 % solution; Use as directed 15 mLs in the mouth or throat 2 (two) times daily.  Dispense: 240 mL; Refill: 0  - Suspected infection with broken tooth - Amoxicillin and ibuprofen prescribed - Viscous lidocaine and Peridex mouth rinse prescribed at patient request - Can use ice on outside jaw/cheek for swelling - Can also take tylenol for pain with other medications - Discussed DenTemp putty that can be used to cover a broken tooth - Schedule a follow with a dentist as soon as possible (Can contact [facility] or [facility] [phone] associated with [facility] health department if underinsured or uninsured) - Seek in person evaluation if symptoms fail to improve or if they worsen   Follow Up Instructions: I discussed the assessment and treatment plan with the patient. The patient was provided an opportunity  to ask questions and all were answered. The patient agreed with the plan and demonstrated an understanding of the instructions.  A copy of instructions were sent to the patient via MyChart unless otherwise noted below.    The patient was advised to call back or seek an in-person evaluation if the symptoms worsen or if the condition fails to improve as anticipated.  Time:  I spent 10 minutes with the patient via telehealth technology discussing the above problems/concerns.    Mar Daring, PA-C

## 2022-11-05 NOTE — Patient Instructions (Signed)
Christina Nelson, thank you for joining Mar Daring, PA-C for today's virtual visit.  While this provider is not your primary care provider (PCP), if your PCP is located in our provider database this encounter information will be shared with them immediately following your visit.   [facility] [facility] account gives you access to today's visit and all your visits, tests, and labs performed at [facility] " click here if you don't have a [facility] account or go to [email or web address]  Consent: (Patient) Christina Nelson provided verbal consent for this virtual visit at the beginning of the encounter.  Current Medications:  Current Outpatient Medications:    chlorhexidine (PERIDEX) 0.12 % solution, Use as directed 15 mLs in the mouth or throat 2 (two) times daily., Disp: 240 mL, Rfl: 0   clindamycin (CLEOCIN) 300 MG capsule, Take 1 capsule (300 mg total) by mouth 3 (three) times daily., Disp: 30 capsule, Rfl: 0   ibuprofen (ADVIL) 800 MG tablet, Take 1 tablet (800 mg total) by mouth every 8 (eight) hours as needed., Disp: 30 tablet, Rfl: 0   lidocaine (XYLOCAINE) 2 % solution, Use as directed 15 mLs in the mouth or throat every 4 (four) hours as needed for mouth pain., Disp: 200 mL, Rfl: 0   amLODipine (NORVASC) 5 MG tablet, Take 1 tablet (5 mg total) by mouth daily., Disp: 30 tablet, Rfl: 0   buprenorphine-naloxone (SUBOXONE) 8-2 mg SUBL SL tablet, Place 1 tablet under the tongue every 12 (twelve) hours., Disp: , Rfl:    cloNIDine (CATAPRES) 0.1 MG tablet, Take 1 tablet (0.1 mg total) by mouth 2 (two) times daily., Disp: 30 tablet, Rfl: 0   dicyclomine (BENTYL) 20 MG tablet, Take 1 tablet (20 mg total) by mouth 2 (two) times daily., Disp: 20 tablet, Rfl: 0   famotidine (PEPCID) 40 MG tablet, Take 0.5 tablets (20 mg total) by mouth 2 (two) times daily., Disp: 30 tablet, Rfl: 0   gabapentin (NEURONTIN) 300 MG capsule, Take 1 capsule (300 mg total) by mouth 3 (three)  times daily., Disp: 90 capsule, Rfl: 0   levETIRAcetam (KEPPRA) 500 MG tablet, Take 1 tablet three times a day, Disp: 90 tablet, Rfl: 1   Medications ordered in this encounter:  Meds ordered this encounter  Medications   clindamycin (CLEOCIN) 300 MG capsule    Sig: Take 1 capsule (300 mg total) by mouth 3 (three) times daily.    Dispense:  30 capsule    Refill:  0    Order Specific Question:   Supervising Provider    Answer:   Chase Picket [id number]   ibuprofen (ADVIL) 800 MG tablet    Sig: Take 1 tablet (800 mg total) by mouth every 8 (eight) hours as needed.    Dispense:  30 tablet    Refill:  0    Order Specific Question:   Supervising Provider    Answer:   Chase Picket [[id number]]   lidocaine (XYLOCAINE) 2 % solution    Sig: Use as directed 15 mLs in the mouth or throat every 4 (four) hours as needed for mouth pain.    Dispense:  200 mL    Refill:  0    Order Specific Question:   Supervising Provider    Answer:   Chase Picket [id number]   chlorhexidine (PERIDEX) 0.12 % solution    Sig: Use as directed 15 mLs in the mouth or throat 2 (two) times daily.  Dispense:  240 mL    Refill:  0    Order Specific Question:   Supervising Provider    Answer:   Chase Picket [[id number]]     *If you need refills on other medications prior to your next appointment, please contact your pharmacy*  Follow-Up: Call back or seek an in-person evaluation if the symptoms worsen or if the condition fails to improve as anticipated.  [facility] [phone]  Other Instructions  [facility] at [place], [place], [place] [address].For appointments and more information, call [phone].   Dental Abscess  A dental abscess is an infection around a tooth that may involve pain, swelling, and a collection of pus, as well as other symptoms. Treatment is important to help with symptoms and to prevent the infection from spreading. The general types  of dental abscesses are: Pulpal abscess. This abscess may form from the inner part of the tooth (pulp). Periodontal abscess. This abscess may form from the gum. What are the causes? This condition is caused by a bacterial infection in or around the tooth. It may result from: Severe tooth decay (cavities). Trauma to the tooth, such as a broken or chipped tooth. What increases the risk? This condition is more likely to develop in males. It is also more likely to develop in people who: Have cavities. Have severe gum disease. Eat sugary snacks between meals. Use tobacco products. Have diabetes. Have a weakened disease-fighting system (immune system). Do not brush and care for their teeth regularly. What are the signs or symptoms? Mild symptoms of this condition include: Tenderness. Bad breath. Fever. A bitter taste in the mouth. Pain in and around the infected tooth. Moderate symptoms of this condition include: Swollen neck glands. Chills. Pus drainage. Swelling and redness around the infected tooth, in the mouth, or in the face. Severe pain in and around the infected tooth. Severe symptoms of this condition include: Difficulty swallowing. Difficulty opening the mouth. Nausea. Vomiting. How is this diagnosed? This condition is diagnosed based on: Your symptoms and your medical and dental history. An examination of the infected tooth. During the exam, your dental care provider may tap on the infected tooth. You may also need to have X-rays taken of the affected area. How is this treated? This condition is treated by getting rid of the infection. This may be done with: Antibiotic medicines. These may be used in certain situations. Antibacterial mouth rinse. Incision and drainage. This procedure is done by making an incision in the abscess to drain out the pus. Removing pus is the first priority in treating an abscess. A root canal. This may be performed to save the tooth. Your  dental care provider accesses the visible part of your tooth (crown) with a drill and removes any infected pulp. Then the space is filled and sealed off. Tooth extraction. The tooth is pulled out if it cannot be saved by other treatment. You may also receive treatment for pain, such as: Acetaminophen or NSAIDs. Gels that contain a numbing medicine. An injection to block the pain near your nerve. Follow these instructions at home: Medicines Take over-the-counter and prescription medicines only as told by your dental care provider. If you were prescribed an antibiotic, take it as told by your dental care provider. Do not stop taking the antibiotic even if you start to feel better. If you were prescribed a gel that contains a numbing medicine, use it exactly as told in the directions. Do  not use these gels for children who are younger than 82 years of age. Use an antibacterial mouth rinse as told by your dental care provider. General instructions  Gargle with a mixture of salt and water 3-4 times a day or as needed. To make salt water, completely dissolve -1 tsp (3-6 g) of salt in 1 cup (237 mL) of warm water. Eat a soft diet while your abscess is healing. Drink enough fluid to keep your urine pale yellow. Do not apply heat to the outside of your mouth. Do not use any products that contain nicotine or tobacco. These products include cigarettes, chewing tobacco, and vaping devices, such as e-cigarettes. If you need help quitting, ask your dental care provider. Keep all follow-up visits. This is important. How is this prevented?  Excellent dental home care, which includes brushing your teeth every morning and night with fluoride toothpaste. Floss one time each day. Get regularly scheduled dental cleanings. Consider having a dental sealant applied on teeth that have deep grooves to prevent cavities. Drink fluoridated water regularly. This includes most tap water. Check the label on bottled water  to see if it contains fluoride. Reduce or eliminate sugary drinks. Eat healthy meals and snacks. Wear a mouth guard or face shield to protect your teeth while playing sports. Contact a health care provider if: Your pain is worse and is not helped by medicine. You have swelling. You see pus around the tooth. You have a fever or chills. Get help right away if: Your symptoms suddenly get worse. You have a very bad headache. You have problems breathing or swallowing. You have trouble opening your mouth. You have swelling in your neck or around your eye. These symptoms may represent a serious problem that is an emergency. Do not wait to see if the symptoms will go away. Get medical help right away. Call your local emergency services (911 in the [place] Do not drive yourself to the hospital. Summary A dental abscess is a collection of pus in or around a tooth that results from an infection. A dental abscess may result from severe tooth decay, trauma to the tooth, or severe gum disease around a tooth. Symptoms include severe pain, swelling, redness, and drainage of pus in and around the infected tooth. The first priority in treating a dental abscess is to drain out the pus. Treatment may also involve removing damage inside the tooth (root canal) or extracting the tooth. This information is not intended to replace advice given to you by your health care provider. Make sure you discuss any questions you have with your health care provider. Document Revised: 12/25/2020 Document Reviewed: 12/25/2020 Elsevier Patient Education  2023 Elsevier Inc.    If you have been instructed to have an in-person evaluation today at a local Urgent Care facility, please use the link below. It will take you to a list of all of our available [facility], including address, phone number and hours of operation. Please do not delay care.  [facility]  If you or a family member do not have a  primary care provider, use the link below to schedule a visit and establish care. When you choose a [facility] primary care physician or advanced practice provider, you gain a long-term partner in health. Find a Primary Care Provider  Learn more about [facility]'s in-office and virtual care options: [place] - Get Care Now

## 2022-11-08 ENCOUNTER — Telehealth: Payer: Medicaid Other | Admitting: Nurse Practitioner

## 2022-11-08 DIAGNOSIS — R569 Unspecified convulsions: Secondary | ICD-10-CM

## 2022-11-08 MED ORDER — LEVETIRACETAM 500 MG PO TABS: ORAL_TABLET | ORAL | 0 refills | Status: DC

## 2022-11-08 NOTE — Progress Notes (Signed)
Virtual Visit Consent   Christina Nelson, you are scheduled for a virtual visit with a [facility] provider today. Just as with appointments in the office, your consent must be obtained to participate. Your consent will be active for this visit and any virtual visit you may have with one of our providers in the next 365 days. If you have a MyChart account, a copy of this consent can be sent to you electronically.  As this is a virtual visit, video technology does not allow for your provider to perform a traditional examination. This may limit your provider's ability to fully assess your condition. If your provider identifies any concerns that need to be evaluated in person or the need to arrange testing (such as labs, EKG, etc.), we will make arrangements to do so. Although advances in technology are sophisticated, we cannot ensure that it will always work on either your end or our end. If the connection with a video visit is poor, the visit may have to be switched to a telephone visit. With either a video or telephone visit, we are not always able to ensure that we have a secure connection.  By engaging in this virtual visit, you consent to the provision of healthcare and authorize for your insurance to be billed (if applicable) for the services provided during this visit. Depending on your insurance coverage, you may receive a charge related to this service.  I need to obtain your verbal consent now. Are you willing to proceed with your visit today? Christina Nelson has provided verbal consent on 11/08/2022 for a virtual visit (video or telephone). Christina Nelson, Christina Nelson  Date: 11/08/2022 4:26 PM  Virtual Visit via Video Note   I, Christina Nelson, connected with  Christina Nelson  ([id number], Oct 27, 1981) on 11/08/22 at  4:30 PM EST by a video-enabled telemedicine application and verified that I am speaking with the correct person using two identifiers.  Location: Patient: Virtual Visit Location Patient:  Home Provider: Virtual Visit Location Provider: Home Office   I discussed the limitations of evaluation and management by telemedicine and the availability of in person appointments. The patient expressed understanding and agreed to proceed.    History of Present Illness: Christina Nelson is a 42 y.o. who identifies as a female who was assigned female at birth, and is being seen today for refill on medication.  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   Tobacco abuse 05/31/2017   Seizure-like activity (HCC) 05/31/2017   Anxiety with depression 05/31/2017    Allergies:  Allergies  Allergen Reactions   Amoxicillin Hives    Breaks out next day   Penicillins Hives    Has patient had a PCN reaction causing immediate rash, facial/tongue/throat swelling, SOB or lightheadedness with hypotension: NO Has patient had a PCN reaction causing severe rash involving mucus membranes or skin necrosis: No Has patient had a PCN reaction that required hospitalization No Has patient had a PCN reaction occurring within the last 10 years: No If all of the above answers are "  NO", then may proceed with Cephalosporin use. ALL ARE NO    Medications:  Current Outpatient Medications:    amLODipine (NORVASC) 5 MG tablet, Take 1 tablet (5 mg total) by mouth daily., Disp: 30 tablet, Rfl: 0   buprenorphine-naloxone (SUBOXONE) 8-2 mg SUBL SL tablet, Place 1 tablet under the tongue every 12 (twelve) hours., Disp: , Rfl:    chlorhexidine (PERIDEX) 0.12 % solution, Use as directed 15 mLs in the mouth or throat 2 (two) times daily., Disp: 240  mL, Rfl: 0   clindamycin (CLEOCIN) 300 MG capsule, Take 1 capsule (300 mg total) by mouth 3 (three) times daily., Disp: 30 capsule, Rfl: 0   cloNIDine (CATAPRES) 0.1 MG tablet, Take 1 tablet (0.1 mg total) by mouth 2 (two) times daily., Disp: 30 tablet, Rfl: 0   dicyclomine (BENTYL) 20 MG tablet, Take 1 tablet (20 mg total) by mouth 2 (two) times daily., Disp: 20 tablet, Rfl: 0   famotidine (PEPCID) 40 MG tablet, Take 0.5 tablets (20 mg total) by mouth 2 (two) times daily., Disp: 30 tablet, Rfl: 0   gabapentin (NEURONTIN) 300 MG capsule, Take 1 capsule (300 mg total) by mouth 3 (three) times daily., Disp: 90 capsule, Rfl: 0   ibuprofen (ADVIL) 800 MG tablet, Take 1 tablet (800 mg total) by mouth every 8 (eight) hours as needed., Disp: 30 tablet, Rfl: 0   levETIRAcetam (KEPPRA) 500 MG tablet, Take 1 tablet three times a day, Disp: 90 tablet, Rfl: 1   lidocaine (XYLOCAINE) 2 % solution, Use as directed 15 mLs in the mouth or throat every 4 (four) hours as needed for mouth pain., Disp: 200 mL, Rfl: 0  Observations/Objective: Patient is well-developed, well-nourished in no acute distress.  Resting comfortably  at home.  Head is normocephalic, atraumatic.  No labored breathing.  Speech is clear and coherent with logical content.  Patient is alert and oriented at baseline.    Assessment and Plan: 1. Seizure-like activity (HCC) Medication refill only- - levETIRAcetam (KEPPRA) 500 MG tablet; Take 1 tablet three times a day  Dispense: 90 tablet; Refill: 0    Scheduled patient with new PCP for 12/09/22 and advised we will not be able to continue refills for this medication, emphasized need for PCP   Follow Up Instructions: I discussed the assessment and treatment plan with the patient. The patient was provided an opportunity to ask questions and all were answered. The patient agreed with the plan and demonstrated an understanding of the instructions.  A copy of instructions were sent to the patient  via MyChart unless otherwise noted below.    The patient was advised to call back or seek an in-person evaluation if the symptoms worsen or if the condition fails to improve as anticipated.  Time:  I spent 10 minutes with the patient via telehealth technology discussing the above problems/concerns.    Apolonio Schneiders, Christina Nelson

## 2022-11-08 NOTE — Patient Instructions (Signed)
New Patient Visit with Christina Alanis, NP Thursday December 09, 2022 1:00 PM (30 minutes) [facility] [address] [place] [address] [phone]

## 2022-12-09 ENCOUNTER — Ambulatory Visit: Payer: Self-pay | Admitting: Orthopedic Surgery

## 2022-12-19 ENCOUNTER — Telehealth: Payer: Medicaid Other | Admitting: Nurse Practitioner

## 2022-12-19 DIAGNOSIS — F418 Other specified anxiety disorders: Secondary | ICD-10-CM | POA: Diagnosis not present

## 2022-12-19 DIAGNOSIS — R569 Unspecified convulsions: Secondary | ICD-10-CM | POA: Diagnosis not present

## 2022-12-19 MED ORDER — GABAPENTIN 300 MG PO CAPS: 300.0000 mg | ORAL_CAPSULE | Freq: Three times a day (TID) | ORAL | 0 refills | Status: AC

## 2022-12-19 MED ORDER — QUETIAPINE FUMARATE 50 MG PO TABS: 50.0000 mg | ORAL_TABLET | Freq: Every day | ORAL | 0 refills | Status: AC

## 2022-12-19 MED ORDER — LEVETIRACETAM 500 MG PO TABS: ORAL_TABLET | ORAL | 0 refills | Status: AC

## 2022-12-19 NOTE — Progress Notes (Signed)
Virtual Visit Consent   Christina Nelson, you are scheduled for a virtual visit with a [facility] provider today. Just as with appointments in the office, your consent must be obtained to participate. Your consent will be active for this visit and any virtual visit you may have with one of our providers in the next 365 days. If you have a MyChart account, a copy of this consent can be sent to you electronically.  As this is a virtual visit, video technology does not allow for your provider to perform a traditional examination. This may limit your provider's ability to fully assess your condition. If your provider identifies any concerns that need to be evaluated in person or the need to arrange testing (such as labs, EKG, etc.), we will make arrangements to do so. Although advances in technology are sophisticated, we cannot ensure that it will always work on either your end or our end. If the connection with a video visit is poor, the visit may have to be switched to a telephone visit. With either a video or telephone visit, we are not always able to ensure that we have a secure connection.  By engaging in this virtual visit, you consent to the provision of healthcare and authorize for your insurance to be billed (if applicable) for the services provided during this visit. Depending on your insurance coverage, you may receive a charge related to this service.  I need to obtain your verbal consent now. Are you willing to proceed with your visit today? [name] has provided verbal consent on 12/19/2022 for a virtual visit (video or telephone). Gildardo Pounds, NP  Date: 12/19/2022 12:03 PM  Virtual Visit via Video Note   I, Gildardo Pounds, connected with  Christina Nelson  ([id number], 1981/03/10) on 12/19/22 at 12:00 PM EST by a video-enabled telemedicine application and verified that I am speaking with the correct person using two identifiers.  Location: Patient: Virtual Visit Location Patient:  Home Provider: Virtual Visit Location Provider: Home Office   I discussed the limitations of evaluation and management by telemedicine and the availability of in person appointments. The patient expressed understanding and agreed to proceed.    History of Present Illness: Christina Nelson is a 42 y.o. who identifies as a female who was assigned female at birth, and is being seen today for Medication refills.  [name] states she will need to cancel her establish care visit with the new PCP tomorrow due to transportation issues. She is requesting a refill of gabapentin, seroquel and keppra as she will be running out. She is overdue for follow up with Neurology as well. I do not feel it is safe to continue refills of keppra until she is seen by PCP or Neurology and  had blood work obtained. I also voiced my concerns to [name] who verbalized understanding.  Also gabapentin is dosed based on renal function and we do not have a recent cr+ on file for her.    Problems:  Patient Active Problem List   Diagnosis Date Noted   Medication refill 06/21/2022   Dental abscess 01/09/2022   Tobacco dependence 01/09/2022   Substance abuse in remission ([place]) 05/31/2017   Tobacco abuse 05/31/2017   Seizure-like activity ([facility]) 05/31/2017   Anxiety with depression 05/31/2017    Allergies:  Allergies  Allergen Reactions   Amoxicillin Hives    Breaks out next day   Penicillins Hives    Has patient had a  PCN reaction causing immediate rash, facial/tongue/throat swelling, SOB or lightheadedness with hypotension: NO Has patient had a PCN reaction causing severe rash involving mucus membranes or skin necrosis: No Has patient had a PCN reaction that required hospitalization No Has patient had a PCN reaction occurring within the last 10 years: No If all of the above answers are "NO", then may proceed with Cephalosporin use. ALL ARE NO    Medications:  Current Outpatient Medications:    amLODipine  (NORVASC) 5 MG tablet, Take 1 tablet (5 mg total) by mouth daily., Disp: 30 tablet, Rfl: 0   buprenorphine-naloxone (SUBOXONE) 8-2 mg SUBL SL tablet, Place 1 tablet under the tongue every 12 (twelve) hours., Disp: , Rfl:    chlorhexidine (PERIDEX) 0.12 % solution, Use as directed 15 mLs in the mouth or throat 2 (two) times daily., Disp: 240 mL, Rfl: 0   clindamycin (CLEOCIN) 300 MG capsule, Take 1 capsule (300 mg total) by mouth 3 (three) times daily., Disp: 30 capsule, Rfl: 0   cloNIDine (CATAPRES) 0.1 MG tablet, Take 1 tablet (0.1 mg total) by mouth 2 (two) times daily., Disp: 30 tablet, Rfl: 0   dicyclomine (BENTYL) 20 MG tablet, Take 1 tablet (20 mg total) by mouth 2 (two) times daily., Disp: 20 tablet, Rfl: 0   escitalopram (LEXAPRO) 10 MG tablet, Take 10 mg by mouth daily., Disp: , Rfl:    famotidine (PEPCID) 40 MG tablet, Take 0.5 tablets (20 mg total) by mouth 2 (two) times daily., Disp: 30 tablet, Rfl: 0   gabapentin (NEURONTIN) 300 MG capsule, Take 1 capsule (300 mg total) by mouth 3 (three) times daily. NO ADDITIONAL REFILLS, Disp: 90 capsule, Rfl: 0   ibuprofen (ADVIL) 800 MG tablet, Take 1 tablet (800 mg total) by mouth every 8 (eight) hours as needed., Disp: 30 tablet, Rfl: 0   levETIRAcetam (KEPPRA) 500 MG tablet, Take 1 tablet three times a day. NO additional refills, Disp: 90 tablet, Rfl: 0   lidocaine (XYLOCAINE) 2 % solution, Use as directed 15 mLs in the mouth or throat every 4 (four) hours as needed for mouth pain., Disp: 200 mL, Rfl: 0   QUEtiapine (SEROQUEL) 50 MG tablet, Take 1 tablet (50 mg total) by mouth at bedtime. NO ADDITIONAL REFILLS, Disp: 30 tablet, Rfl: 0  Observations/Objective: Patient is well-developed, well-nourished in no acute distress.  Resting comfortably at home.  Head is normocephalic, atraumatic.  No labored breathing.  Speech is clear and coherent with logical content.  Patient is alert and oriented at baseline.    Assessment and Plan: 1.  Seizure-like activity (HCC) - levETIRAcetam (KEPPRA) 500 MG tablet; Take 1 tablet three times a day. NO additional refills  Dispense: 90 tablet; Refill: 0  2. Anxiety with depression - gabapentin (NEURONTIN) 300 MG capsule; Take 1 capsule (300 mg total) by mouth 3 (three) times daily. NO ADDITIONAL REFILLS  Dispense: 90 capsule; Refill: 0 - QUEtiapine (SEROQUEL) 50 MG tablet; Take 1 tablet (50 mg total) by mouth at bedtime. NO ADDITIONAL REFILLS  Dispense: 30 tablet; Refill: 0  Ms. Bourdeau was instructed that she will need to follow up with Neurology or PCP for additional refills of keppra or gabapentin as she has not had blood work for keppra levels  in 3 years.   Follow Up Instructions: I discussed the assessment and treatment plan with the patient. The patient was provided an opportunity to ask questions and all were answered. The patient agreed with the plan and demonstrated an understanding of  the instructions.  A copy of instructions were sent to the patient via MyChart unless otherwise noted below.    The patient was advised to call back or seek an in-person evaluation if the symptoms worsen or if the condition fails to improve as anticipated.  Time:  I spent 12 minutes with the patient via telehealth technology discussing the above problems/concerns.    Gildardo Pounds, NP

## 2022-12-19 NOTE — Patient Instructions (Addendum)
Christina Nelson, thank you for joining Gildardo Pounds, NP for today's virtual visit.  While this provider is not your primary care provider (PCP), if your PCP is located in our provider database this encounter information will be shared with them immediately following your visit.   [facility] account gives you access to today's visit and all your visits, tests, and labs performed at [facility] " click here if you don't have a [facility] account or go to [email or web address]  Consent: (Patient) Christina Nelson provided verbal consent for this virtual visit at the beginning of the encounter.  Current Medications:  Current Outpatient Medications:    amLODipine (NORVASC) 5 MG tablet, Take 1 tablet (5 mg total) by mouth daily., Disp: 30 tablet, Rfl: 0   buprenorphine-naloxone (SUBOXONE) 8-2 mg SUBL SL tablet, Place 1 tablet under the tongue every 12 (twelve) hours., Disp: , Rfl:    chlorhexidine (PERIDEX) 0.12 % solution, Use as directed 15 mLs in the mouth or throat 2 (two) times daily., Disp: 240 mL, Rfl: 0   clindamycin (CLEOCIN) 300 MG capsule, Take 1 capsule (300 mg total) by mouth 3 (three) times daily., Disp: 30 capsule, Rfl: 0   cloNIDine (CATAPRES) 0.1 MG tablet, Take 1 tablet (0.1 mg total) by mouth 2 (two) times daily., Disp: 30 tablet, Rfl: 0   dicyclomine (BENTYL) 20 MG tablet, Take 1 tablet (20 mg total) by mouth 2 (two) times daily., Disp: 20 tablet, Rfl: 0   escitalopram (LEXAPRO) 10 MG tablet, Take 10 mg by mouth daily., Disp: , Rfl:    famotidine (PEPCID) 40 MG tablet, Take 0.5 tablets (20 mg total) by mouth 2 (two) times daily., Disp: 30 tablet, Rfl: 0   gabapentin (NEURONTIN) 300 MG capsule, Take 1 capsule (300 mg total) by mouth 3 (three) times daily. NO ADDITIONAL REFILLS, Disp: 90 capsule, Rfl: 0   ibuprofen (ADVIL) 800 MG tablet, Take 1 tablet (800 mg total) by mouth every 8 (eight) hours as needed., Disp: 30 tablet, Rfl: 0   levETIRAcetam  (KEPPRA) 500 MG tablet, Take 1 tablet three times a day. NO additional refills, Disp: 90 tablet, Rfl: 0   lidocaine (XYLOCAINE) 2 % solution, Use as directed 15 mLs in the mouth or throat every 4 (four) hours as needed for mouth pain., Disp: 200 mL, Rfl: 0   QUEtiapine (SEROQUEL) 50 MG tablet, Take 1 tablet (50 mg total) by mouth at bedtime. NO ADDITIONAL REFILLS, Disp: 30 tablet, Rfl: 0   Medications ordered in this encounter:  Meds ordered this encounter  Medications   levETIRAcetam (KEPPRA) 500 MG tablet    Sig: Take 1 tablet three times a day. NO additional refills    Dispense:  90 tablet    Refill:  0    Order Specific Question:   Supervising Provider    Answer:   Chase Picket [id number]   gabapentin (NEURONTIN) 300 MG capsule    Sig: Take 1 capsule (300 mg total) by mouth 3 (three) times daily. NO ADDITIONAL REFILLS    Dispense:  90 capsule    Refill:  0    Order Specific Question:   Supervising Provider    Answer:   Chase Picket [id number]   QUEtiapine (SEROQUEL) 50 MG tablet    Sig: Take 1 tablet (50 mg total) by mouth at bedtime. NO ADDITIONAL REFILLS    Dispense:  30 tablet    Refill:  0    Order Specific Question:  Supervising Provider    Answer:   Chase Picket [id number]     *If you need refills on other medications prior to your next appointment, please contact your pharmacy*  Follow-Up: Call back or seek an in-person evaluation if the symptoms worsen or if the condition fails to improve as anticipated.  [employer] [phone]  Other Instructions Ms. Bown was instructed that she will need to follow up with Neurology or PCP for additional refills of keppra or gabapentin as she has not had blood work for keppra levels  in 3 years.    If you have been instructed to have an in-person evaluation today at a local Urgent Care facility, please use the link below. It will take you to a list of all of our available [facility],  including address, phone number and hours of operation. Please do not delay care.  [facility]  If you or a family member do not have a primary care provider, use the link below to schedule a visit and establish care. When you choose a [facility] primary care physician or advanced practice provider, you gain a long-term partner in health. Find a Primary Care Provider  Learn more about [facility]'s in-office and virtual care options: [facility]

## 2022-12-20 ENCOUNTER — Ambulatory Visit: Payer: Medicaid Other | Admitting: Adult Health

## 2023-01-13 ENCOUNTER — Encounter: Payer: Medicaid Other | Admitting: Adult Health

## 2023-01-13 NOTE — Progress Notes (Signed)
This encounter was created in error - please disregard.

## 2023-02-07 ENCOUNTER — Encounter: Payer: Medicaid Other | Admitting: Adult Health

## 2023-02-07 NOTE — Progress Notes (Unsigned)
This encounter was created in error - please disregard.

## 2023-02-14 ENCOUNTER — Telehealth: Payer: Medicaid Other | Admitting: Nurse Practitioner

## 2023-02-14 DIAGNOSIS — Z760 Encounter for issue of repeat prescription: Secondary | ICD-10-CM

## 2023-02-14 NOTE — Patient Instructions (Addendum)
  [facility]gent [facility]  [email or web address]  [facility] [place] [address] [place], [place] [address] HelpLine: (506)[phone] or [phone]  Primary Care and pediatrics  Choose New Patients Schedule Online: [email or web address]  Also recommended calling the number on the [facility] card to locate a pediatrician for son

## 2023-02-14 NOTE — Progress Notes (Signed)
Spoke with [employer] her concern today is for her son and his social anxiety   Told patient that she would need to schedule an appointment under her son's name   Sending information to patient on scheduling with Behavioral Health, pediatrics and for herself a PCP  Also reminded patient that she will need to schedule a primary care appointment as this service cannot refill medications for her again- she has been scheduled for PCP twice most recently 12/20/22 she did not go to appointment   Patient verbalizes understanding on how to schedule appointments online through [employer] and Mychart

## 2023-03-08 ENCOUNTER — Telehealth: Payer: Medicaid Other

## 2023-07-18 ENCOUNTER — Telehealth: Payer: Medicaid Other

## 2023-07-18 DIAGNOSIS — K047 Periapical abscess without sinus: Secondary | ICD-10-CM

## 2023-07-18 MED ORDER — CLINDAMYCIN HCL 300 MG PO CAPS: 300.0000 mg | ORAL_CAPSULE | Freq: Three times a day (TID) | ORAL | 0 refills | Status: AC

## 2023-07-18 NOTE — Progress Notes (Signed)
Virtual Visit Consent   [name], you are scheduled for a virtual visit with a [facility] provider today. Just as with appointments in the office, your consent must be obtained to participate. Your consent will be active for this visit and any virtual visit you may have with one of our providers in the next 365 days. If you have a MyChart account, a copy of this consent can be sent to you electronically.  As this is a virtual visit, video technology does not allow for your provider to perform a traditional examination. This may limit your provider's ability to fully assess your condition. If your provider identifies any concerns that need to be evaluated in person or the need to arrange testing (such as labs, EKG, etc.), we will make arrangements to do so. Although advances in technology are sophisticated, we cannot ensure that it will always work on either your end or our end. If the connection with a video visit is poor, the visit may have to be switched to a telephone visit. With either a video or telephone visit, we are not always able to ensure that we have a secure connection.  By engaging in this virtual visit, you consent to the provision of healthcare and authorize for your insurance to be billed (if applicable) for the services provided during this visit. Depending on your insurance coverage, you may receive a charge related to this service.  I need to obtain your verbal consent now. Are you willing to proceed with your visit today? [name] has provided verbal consent on 07/18/2023 for a virtual visit (video or telephone). Viviano Simas, FNP  Date: 07/18/2023 11:27 AM  Virtual Visit via Video Note   I, Viviano Simas, connected with  Christina Nelson  ([id number], 1981/03/13) on 07/18/23 at 11:30 AM EDT by a video-enabled telemedicine application and verified that I am speaking with the correct person using two identifiers.  Location: Patient: Virtual Visit Location Patient:  Home Provider: Virtual Visit Location Provider: Home Office   I discussed the limitations of evaluation and management by telemedicine and the availability of in person appointments. The patient expressed understanding and agreed to proceed.    History of Present Illness: Christina Nelson is a 42 y.o. who identifies as a female who was assigned female at birth, and is being seen today for a suspected infection under one of her teeth.   She feels her top left tooth is bothering her and she feels swelling into her gums  She has not had a problem with this tooth in the past, but has suffered chronic recurrent dental infections.   Most recent treatment was in January of this year with antibiotics.    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   Tobacco abuse 05/31/2017   Seizure-like activity (HCC) 05/31/2017   Anxiety with depression 05/31/2017    Allergies:  Allergies  Allergen Reactions   Amoxicillin Hives    Breaks out next day   Penicillins Hives    Has patient had a PCN reaction causing immediate rash, facial/tongue/throat swelling, SOB or lightheadedness with hypotension: NO Has patient had a PCN reaction causing severe rash involving mucus membranes or skin necrosis: No Has patient had a PCN reaction that required hospitalization No Has patient had a PCN reaction occurring within the last 10 years:  No If all of the above answers are "NO", then may proceed with Cephalosporin use. ALL ARE NO    Medications:  Current Outpatient Medications:    amLODipine (NORVASC) 5 MG tablet, Take 1 tablet (5 mg total) by mouth daily., Disp: 30 tablet, Rfl: 0   buprenorphine-naloxone (SUBOXONE) 8-2 mg SUBL SL tablet, Place 1 tablet under the tongue every 12 (twelve) hours., Disp: , Rfl:    chlorhexidine (PERIDEX) 0.12 % solution, Use as  directed 15 mLs in the mouth or throat 2 (two) times daily., Disp: 240 mL, Rfl: 0   clindamycin (CLEOCIN) 300 MG capsule, Take 1 capsule (300 mg total) by mouth 3 (three) times daily., Disp: 30 capsule, Rfl: 0   cloNIDine (CATAPRES) 0.1 MG tablet, Take 1 tablet (0.1 mg total) by mouth 2 (two) times daily., Disp: 30 tablet, Rfl: 0   dicyclomine (BENTYL) 20 MG tablet, Take 1 tablet (20 mg total) by mouth 2 (two) times daily., Disp: 20 tablet, Rfl: 0   escitalopram (LEXAPRO) 10 MG tablet, Take 10 mg by mouth daily., Disp: , Rfl:    famotidine (PEPCID) 40 MG tablet, Take 0.5 tablets (20 mg total) by mouth 2 (two) times daily., Disp: 30 tablet, Rfl: 0   gabapentin (NEURONTIN) 300 MG capsule, Take 1 capsule (300 mg total) by mouth 3 (three) times daily. NO ADDITIONAL REFILLS, Disp: 90 capsule, Rfl: 0   ibuprofen (ADVIL) 800 MG tablet, Take 1 tablet (800 mg total) by mouth every 8 (eight) hours as needed., Disp: 30 tablet, Rfl: 0   levETIRAcetam (KEPPRA) 500 MG tablet, Take 1 tablet three times a day. NO additional refills, Disp: 90 tablet, Rfl: 0   lidocaine (XYLOCAINE) 2 % solution, Use as directed 15 mLs in the mouth or throat every 4 (four) hours as needed for mouth pain., Disp: 200 mL, Rfl: 0   QUEtiapine (SEROQUEL) 50 MG tablet, Take 1 tablet (50 mg total) by mouth at bedtime. NO ADDITIONAL REFILLS, Disp: 30 tablet, Rfl: 0  Observations/Objective: Patient is well-developed, well-nourished in no acute distress.  Resting comfortably  at home.  Head is normocephalic, atraumatic.  No labored breathing.  Speech is clear and coherent with logical content.  Patient is alert and oriented at baseline.    Assessment and Plan:  1. Dental infection  - clindamycin (CLEOCIN) 300 MG capsule; Take 1 capsule (300 mg total) by mouth 3 (three) times daily for 7 days.  Dispense: 21 capsule; Refill: 0    Advised follow up with Dentist as soon as possible  She is currently having issues with transportation to  an appointment but is working on it   Follow Up Instructions: I discussed the assessment and treatment plan with the patient. The patient was provided an opportunity to ask questions and all were answered. The patient agreed with the plan and demonstrated an understanding of the instructions.  A copy of instructions were sent to the patient via MyChart unless otherwise noted below.    The patient was advised to call back or seek an in-person evaluation if the symptoms worsen or if the condition fails to improve as anticipated.  Time:  I spent 10 minutes with the patient via telehealth technology discussing the above problems/concerns.    Viviano Simas, FNP
